# Patient Record
Sex: Female | Born: 1975 | Race: White | Hispanic: Yes | Marital: Single | Smoking: Never smoker
Health system: Southern US, Community
[De-identification: ages and names within clinical notes are randomized; demographics above are authoritative.]

## PROBLEM LIST (undated history)

## (undated) ENCOUNTER — Ambulatory Visit (HOSPITAL_COMMUNITY): Admission: EM | Payer: Self-pay

---

## 2003-10-25 ENCOUNTER — Inpatient Hospital Stay (HOSPITAL_COMMUNITY): Admission: AD | Admit: 2003-10-25 | Discharge: 2003-10-25 | Payer: Self-pay | Admitting: Obstetrics & Gynecology

## 2003-12-19 ENCOUNTER — Inpatient Hospital Stay (HOSPITAL_COMMUNITY): Admission: AD | Admit: 2003-12-19 | Discharge: 2003-12-19 | Payer: Self-pay | Admitting: Obstetrics & Gynecology

## 2003-12-22 ENCOUNTER — Inpatient Hospital Stay (HOSPITAL_COMMUNITY): Admission: AD | Admit: 2003-12-22 | Discharge: 2003-12-24 | Payer: Self-pay | Admitting: Obstetrics and Gynecology

## 2004-12-29 ENCOUNTER — Ambulatory Visit: Payer: Self-pay | Admitting: *Deleted

## 2004-12-30 ENCOUNTER — Ambulatory Visit (HOSPITAL_COMMUNITY): Admission: RE | Admit: 2004-12-30 | Discharge: 2004-12-30 | Payer: Self-pay | Admitting: *Deleted

## 2005-01-12 ENCOUNTER — Ambulatory Visit: Payer: Self-pay | Admitting: *Deleted

## 2005-01-19 ENCOUNTER — Ambulatory Visit: Payer: Self-pay | Admitting: *Deleted

## 2005-01-26 ENCOUNTER — Ambulatory Visit: Payer: Self-pay | Admitting: *Deleted

## 2005-02-02 ENCOUNTER — Ambulatory Visit: Payer: Self-pay | Admitting: *Deleted

## 2005-02-09 ENCOUNTER — Ambulatory Visit (HOSPITAL_COMMUNITY): Admission: RE | Admit: 2005-02-09 | Discharge: 2005-02-09 | Payer: Self-pay | Admitting: *Deleted

## 2005-02-09 ENCOUNTER — Ambulatory Visit: Payer: Self-pay | Admitting: *Deleted

## 2005-02-10 ENCOUNTER — Encounter (INDEPENDENT_AMBULATORY_CARE_PROVIDER_SITE_OTHER): Payer: Self-pay | Admitting: Specialist

## 2005-02-10 ENCOUNTER — Inpatient Hospital Stay (HOSPITAL_COMMUNITY): Admission: AD | Admit: 2005-02-10 | Discharge: 2005-02-13 | Payer: Self-pay | Admitting: *Deleted

## 2005-02-10 ENCOUNTER — Ambulatory Visit: Payer: Self-pay | Admitting: Obstetrics and Gynecology

## 2005-02-15 ENCOUNTER — Inpatient Hospital Stay (HOSPITAL_COMMUNITY): Admission: AD | Admit: 2005-02-15 | Discharge: 2005-02-15 | Payer: Self-pay | Admitting: Obstetrics and Gynecology

## 2007-12-13 ENCOUNTER — Inpatient Hospital Stay (HOSPITAL_COMMUNITY): Admission: AD | Admit: 2007-12-13 | Discharge: 2007-12-13 | Payer: Self-pay | Admitting: Obstetrics & Gynecology

## 2007-12-13 ENCOUNTER — Ambulatory Visit: Payer: Self-pay | Admitting: Obstetrics & Gynecology

## 2008-01-12 ENCOUNTER — Inpatient Hospital Stay (HOSPITAL_COMMUNITY): Admission: AD | Admit: 2008-01-12 | Discharge: 2008-01-12 | Payer: Self-pay | Admitting: Obstetrics & Gynecology

## 2008-01-12 ENCOUNTER — Inpatient Hospital Stay (HOSPITAL_COMMUNITY): Admission: AD | Admit: 2008-01-12 | Discharge: 2008-01-14 | Payer: Self-pay | Admitting: Obstetrics & Gynecology

## 2008-01-12 ENCOUNTER — Ambulatory Visit: Payer: Self-pay | Admitting: Physician Assistant

## 2009-10-07 ENCOUNTER — Emergency Department (HOSPITAL_COMMUNITY): Admission: EM | Admit: 2009-10-07 | Discharge: 2009-10-08 | Payer: Self-pay | Admitting: Emergency Medicine

## 2010-07-20 IMAGING — CT CT ABD-PELV W/ CM
2 of 4 series · 17 of 46 positions shown, 19 images · IV contrast (water/omni  & 80ml omni 300)
Comparison: None available.

CLINICAL DATA: Abdominal pain.  Right lower quadrant pain.

CT ABDOMEN AND PELVIS WITH CONTRAST
TECHNIQUE: Multidetector CT imaging of the abdomen and pelvis was
performed following the standard protocol during bolus
administration of intravenous contrast.
Contrast: 80 ml Rmnipaque-FOO.

[Series 2: routine abdomen · axial · 0.81mm/px · z∈[-401,-26]mm · 14 of 83 slices shown, 16 images]
[im 4/83  soft-tissue]
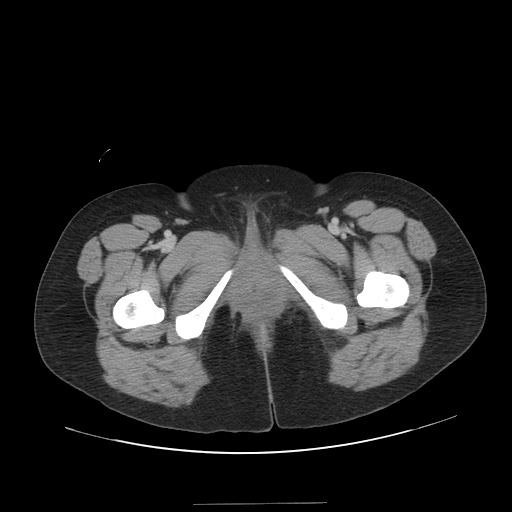
[im 4/83  bone]
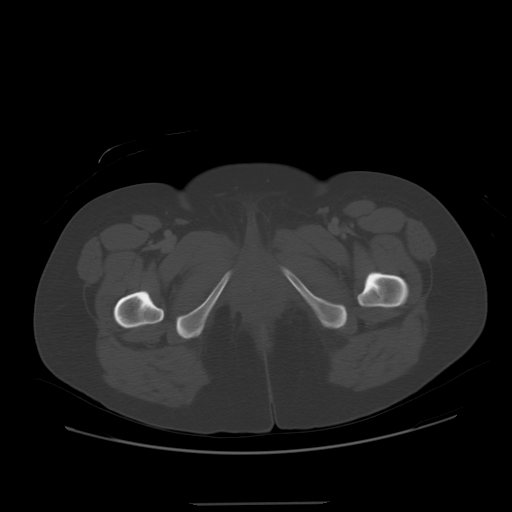
[im 12/83  soft-tissue]
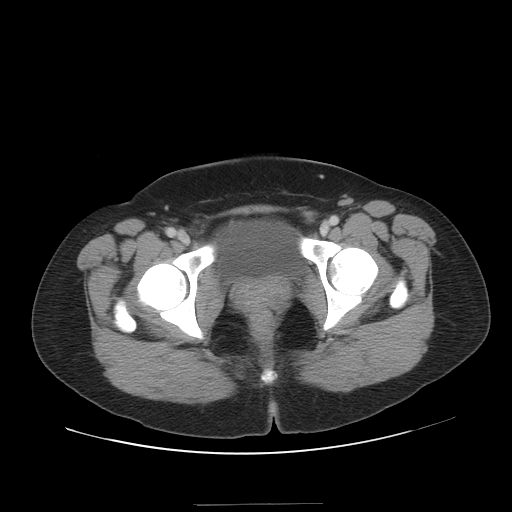
[im 15/83  soft-tissue]
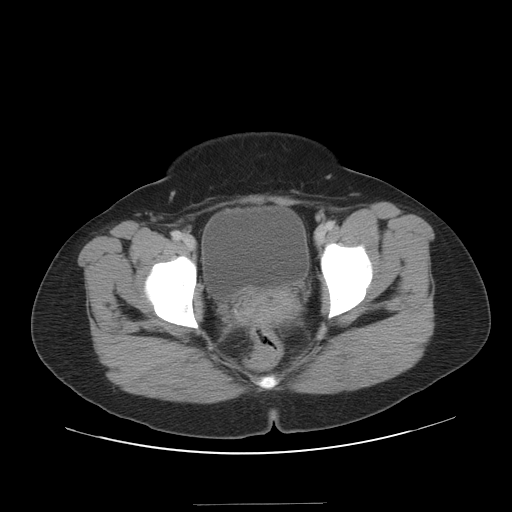
[im 23/83  soft-tissue]
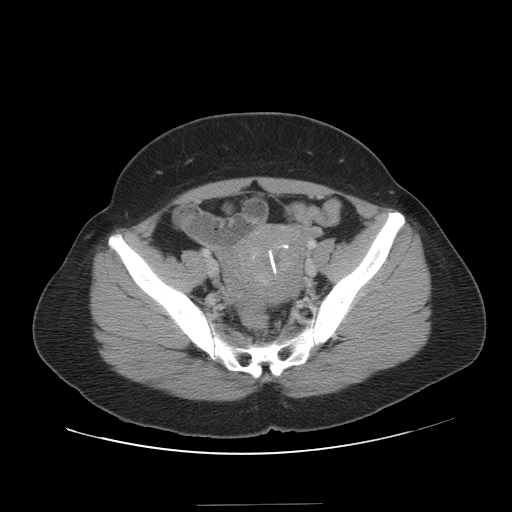
[im 27/83  soft-tissue]
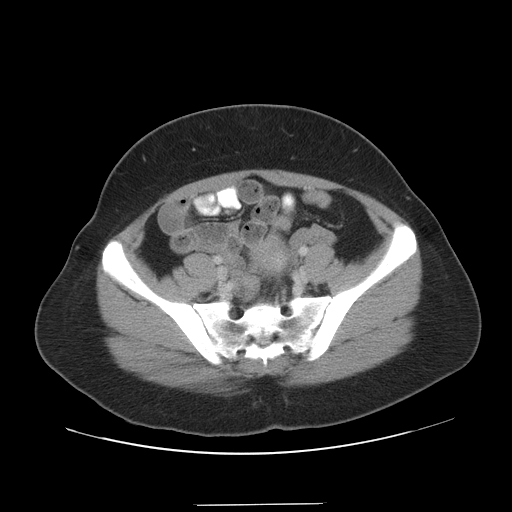
[im 34/83  soft-tissue]
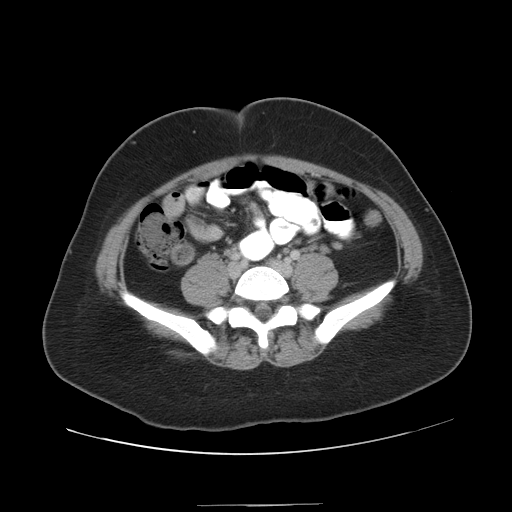
[im 38/83  soft-tissue]
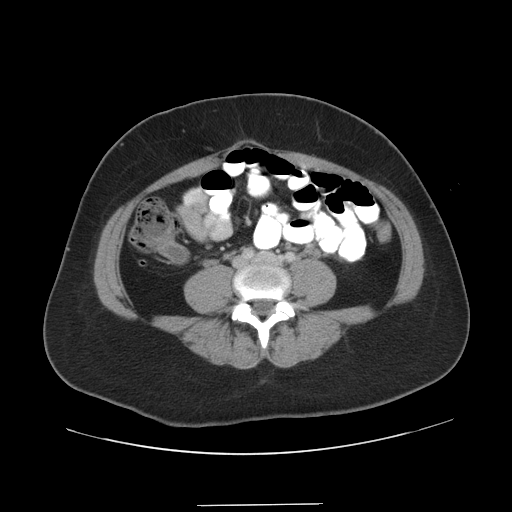
[im 45/83  soft-tissue]
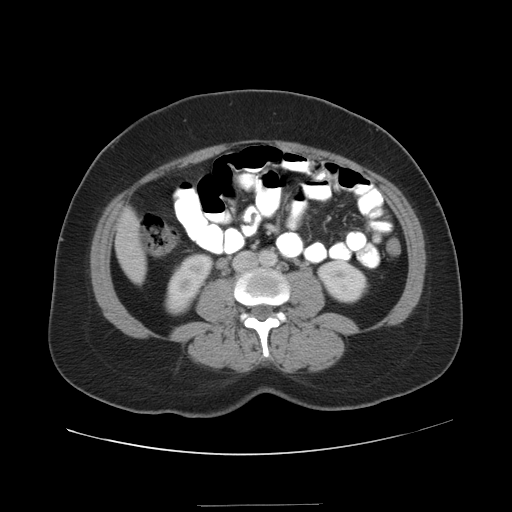
[im 49/83  soft-tissue]
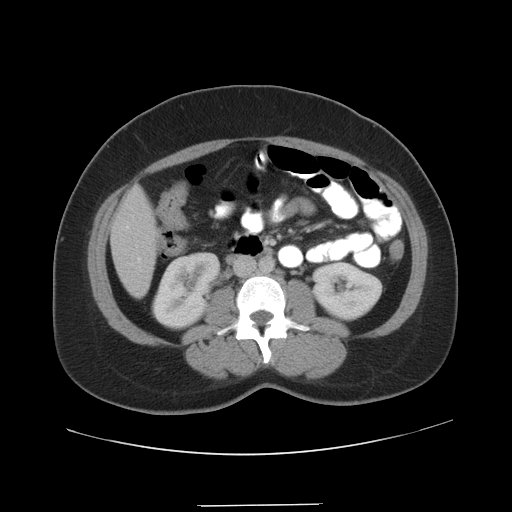
[im 49/83  bone]
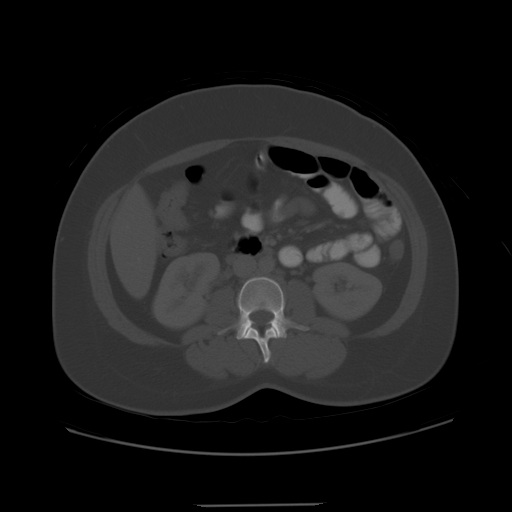
[im 56/83  soft-tissue]
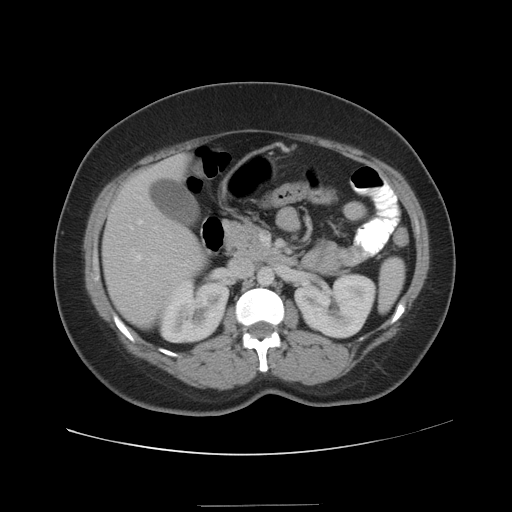
[im 60/83  soft-tissue]
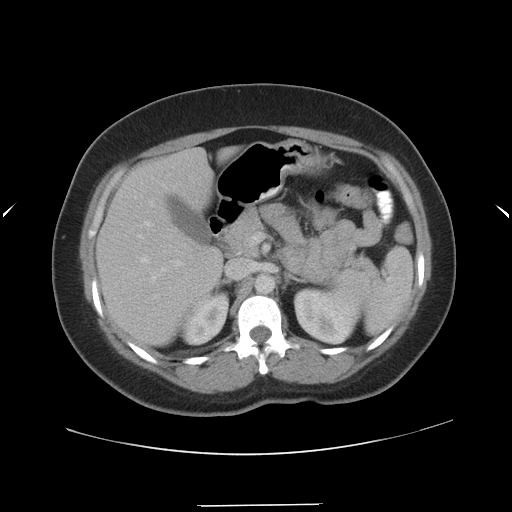
[im 68/83  soft-tissue]
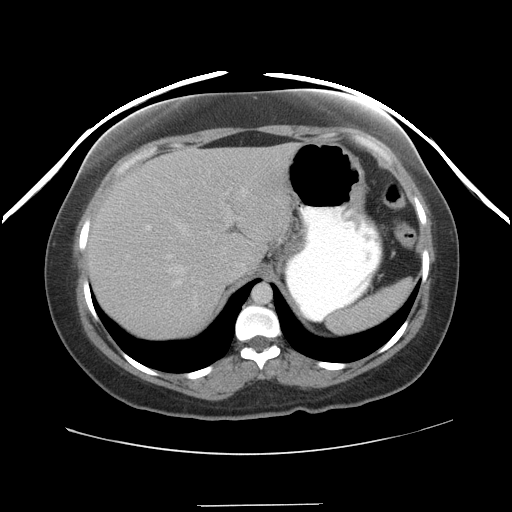
[im 71/83  soft-tissue]
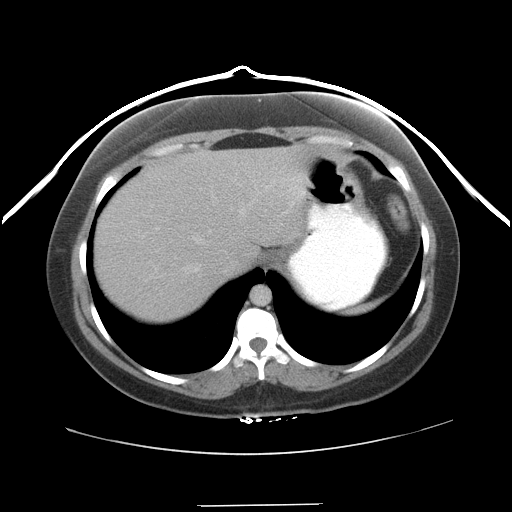
[im 79/83  soft-tissue]
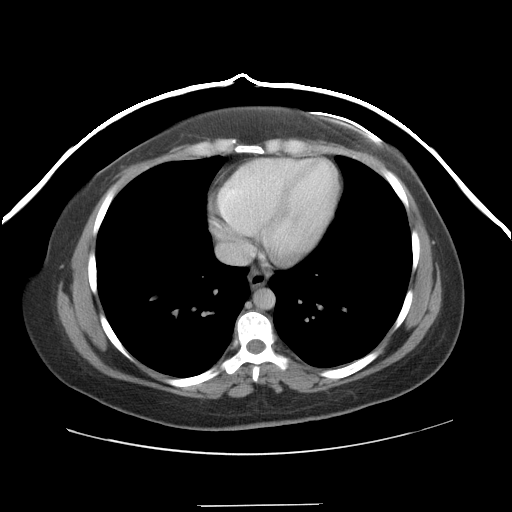

[Series 401: cor · coronal · 0.81mm/px · 3 of 96 slices shown]
[im 32/96  soft-tissue]
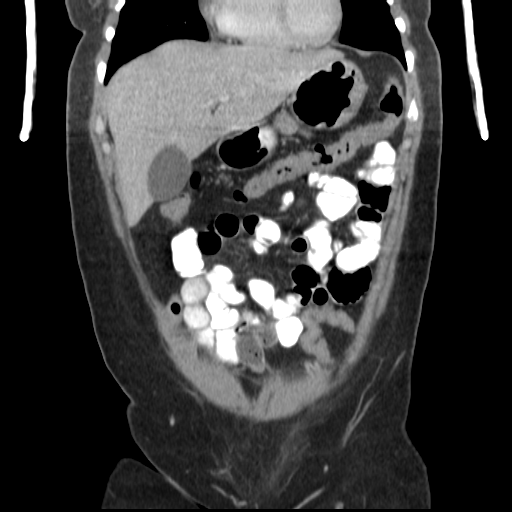
[im 43/96  soft-tissue]
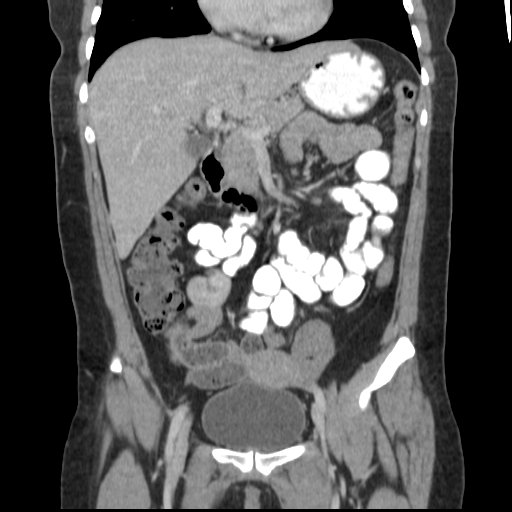
[im 53/96  soft-tissue]
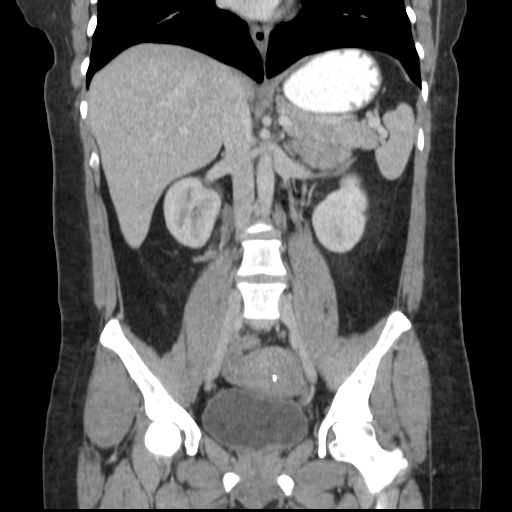

[17 of 46 positions shown; findings below may reference images not displayed]

FINDINGS: Lung bases clear.  No pleural or pericardial effusion.

The liver, gallbladder, biliary tree, adrenal glands, spleen,
pancreas and kidneys all appear normal.  The appendix is well seen
and normal in appearance.  Note is made there is fecal material in
the distal ileum consistent with slow transit.  No evidence of
bowel obstruction, mass or inflammatory process.  No
lymphadenopathy or fluid.
IMPRESSION: 1.  Fecal right material in the distal ileum is consistent with
slow transit of bowel contents.  There is no evidence of bowel
obstruction, inflammatory process and no mass is seen.
2.  Negative for appendicitis or other acute abnormality.

## 2010-09-23 LAB — URINALYSIS, ROUTINE W REFLEX MICROSCOPIC
Bilirubin Urine: NEGATIVE
Glucose, UA: NEGATIVE mg/dL
Ketones, ur: NEGATIVE mg/dL
Protein, ur: 100 mg/dL — AB
pH: 6 (ref 5.0–8.0)

## 2010-09-23 LAB — CBC
HCT: 39.8 % (ref 36.0–46.0)
Hemoglobin: 13.9 g/dL (ref 12.0–15.0)
MCV: 88.7 fL (ref 78.0–100.0)
Platelets: 265 10*3/uL (ref 150–400)
WBC: 17.1 10*3/uL — ABNORMAL HIGH (ref 4.0–10.5)

## 2010-09-23 LAB — WET PREP, GENITAL
Trich, Wet Prep: NONE SEEN
Yeast Wet Prep HPF POC: NONE SEEN

## 2010-09-23 LAB — BASIC METABOLIC PANEL
CO2: 26 mEq/L (ref 19–32)
Calcium: 9 mg/dL (ref 8.4–10.5)
Chloride: 103 mEq/L (ref 96–112)
Potassium: 3.6 mEq/L (ref 3.5–5.1)
Sodium: 136 mEq/L (ref 135–145)

## 2010-09-23 LAB — DIFFERENTIAL
Basophils Relative: 0 % (ref 0–1)
Eosinophils Absolute: 0 10*3/uL (ref 0.0–0.7)
Eosinophils Relative: 0 % (ref 0–5)
Lymphs Abs: 1.6 10*3/uL (ref 0.7–4.0)
Monocytes Relative: 3 % (ref 3–12)
Neutrophils Relative %: 87 % — ABNORMAL HIGH (ref 43–77)

## 2010-09-23 LAB — URINE MICROSCOPIC-ADD ON

## 2010-09-23 LAB — GC/CHLAMYDIA PROBE AMP, GENITAL: Chlamydia, DNA Probe: NEGATIVE

## 2010-09-23 LAB — POCT PREGNANCY, URINE: Preg Test, Ur: NEGATIVE

## 2010-11-20 NOTE — Discharge Summary (Signed)
NAME:  Michaela Finley, Michaela Finley         ACCOUNT NO.:  [id number]   MEDICAL RECORD NO.:  [id number]          PATIENT TYPE:  INP   LOCATION:  WOC                           FACILITY:  [facility]   PHYSICIAN:  Phil D. Okey Dupre, M.D.     DATE OF BIRTH:  24-Jan-1976   DATE OF ADMISSION:  02/09/2005  DATE OF DISCHARGE:  02/13/2005                                 DISCHARGE SUMMARY   REASON FOR ADMISSION:  Onset of labor.   PROCEDURE:  Prenatal none.   PROCEDURE:  Intrapartum cesarean low cervical transverse.   PROCEDURE:  Postpartum none.   COMPLICATIONS:  Operative and postpartum none.   DISCHARGE DIAGNOSIS:  Term pregnancy, delivered.   HOSPITAL COURSE:  A 35 year old, G4, P3-0-0-3 presented at 39-5/7 weeks in  labor and went on to a low transverse cesarean section.  The patient is  breastfeeding and desires IUD for contraception at six weeks follow-up  appointment.  Blood type A positive.  Rubella immune.   DISCHARGE INSTRUCTIONS:  Unrestricted activity.  Nothing per vagina for six  weeks.  Diet routine.   MEDICATIONS:  Percocet, ibuprofen, and prenatal vitamins.   STATUS:  Well.   INSTRUCTIONS:  Routine.   Discharged to home.  Discharge hematocrit 28.6, hemoglobin 9.3 dated February 11, 2005.   FOLLOW-UP NOTE:  Social work or case management has been contacted to have  the patient's postoperative staple removal performed within 2-3 days  following discharge.        JP/MEDQ  D:  02/13/2005  T:  02/13/2005  Job:  [id number]

## 2010-11-20 NOTE — Op Note (Signed)
NAME:  Michaela Finley, Michaela Finley         ACCOUNT NO.:  [id number]   MEDICAL RECORD NO.:  [id number]          PATIENT TYPE:  INP   LOCATION:  9145                          FACILITY:  [facility]   PHYSICIAN:  Phil D. Okey Dupre, M.D.     DATE OF BIRTH:  14-Oct-1975   DATE OF PROCEDURE:  02/10/2005  DATE OF DISCHARGE:                                 OPERATIVE REPORT   PROCEDURE:  Low transverse cesarean section.   PREOPERATIVE DIAGNOSIS:  Cephalopelvic disproportion.   POSTOPERATIVE DIAGNOSES:  1.  Cephalopelvic disproportion.  2.  Transverse presentation with deflexed vertex.   SURGEON:  Javier Glazier. Okey Dupre, M.D.   FIRST ASSISTANT:  Angie B. Childers, M.D.   ESTIMATED BLOOD LOSS:  500 mL.   SPECIMEN TO PATHOLOGY:  Placenta.   POSTOPERATIVE CONDITION:  Satisfactory.   ANESTHESIA:  Epidural.   PROCEDURE:  Under satisfactory epidural anesthesia with the patient in the  dorsal supine position, a Foley catheter in the urinary bladder.  The  abdomen was prepped and draped in the usual sterile manner, entered through  a transverse suprapubic incision situated 3 cm above the symphysis pubis and  extended for a total length of 14 cm.  The abdomen was entered by layers.  On entering the peritoneal cavity, the visceral peritoneum and the anterior  surface of the uterus was opened transversely.  By sharp dissection, the  bladder pushed away from the lower uterine segment, was entered by sharp and  blunt dissection, and from an LOT presentation with a deflexed head, the  baby boy was easily delivered.  Weighed 8 pounds 3 ounces, got a 9/9 Apgar.  Cord pH was 7.33.  The cord was doubly clamped and divided while the airway  was being suctioned with the bulb suction and the cord divided and the baby  handed to the pediatrician.  Samples of blood taken from the cord for  analysis.  Placenta spontaneously removed and the uterus explored.  The  uterus was closed with a continuous running locked 0 Vicryl on an  atraumatic  needle.  One extra figure-of-eight was placed in the right lateral end of  the incision where there was continued oozing.  At the end of the procedure,  no active bleeding was noted.  The area was irrigated with normal saline.  The ovaries and tubes were examined and found to be normal, and the fascia  was closed with continuous running 0 Vicryl on an atraumatic  needle.  Subcutaneous bleeders were controlled with hot cautery, skin edge  staples were used for skin edge approximation.  A dry sterile dressing was  applied.  The patient tolerated the procedure well and was transferred to  the recovery room in satisfactory condition.       PDR/MEDQ  D:  02/10/2005  T:  02/10/2005  Job:  [id number]

## 2011-04-01 LAB — CBC
HCT: 37.6
HCT: 43.5
MCHC: 33.9
MCV: 89.6
Platelets: 191
Platelets: 207
RDW: 14.3
WBC: 13.4 — ABNORMAL HIGH

## 2011-04-01 LAB — URINALYSIS, ROUTINE W REFLEX MICROSCOPIC
Ketones, ur: >80 — AB
Nitrite: NEGATIVE
Protein, ur: NEGATIVE
pH: 6.5

## 2016-10-29 ENCOUNTER — Ambulatory Visit: Payer: Self-pay | Attending: Family Medicine | Admitting: Family Medicine

## 2016-10-29 VITALS — BP 119/76 | HR 74 | Temp 98.1°F | Resp 18 | Ht 59.0 in | Wt 165.4 lb

## 2016-10-29 DIAGNOSIS — G8929 Other chronic pain: Secondary | ICD-10-CM | POA: Insufficient documentation

## 2016-10-29 DIAGNOSIS — Z833 Family history of diabetes mellitus: Secondary | ICD-10-CM | POA: Insufficient documentation

## 2016-10-29 DIAGNOSIS — M25562 Pain in left knee: Secondary | ICD-10-CM | POA: Insufficient documentation

## 2016-10-29 DIAGNOSIS — F419 Anxiety disorder, unspecified: Secondary | ICD-10-CM

## 2016-10-29 DIAGNOSIS — Z0000 Encounter for general adult medical examination without abnormal findings: Secondary | ICD-10-CM | POA: Insufficient documentation

## 2016-10-29 MED ORDER — IBUPROFEN 600 MG PO TABS: 600.0000 mg | ORAL_TABLET | Freq: Three times a day (TID) | ORAL | 0 refills | Status: DC | PRN

## 2016-10-29 MED FILL — IBUPROFEN 600 MG TABLET: 600 | 10 days supply | Qty: 30 | Fill #0

## 2016-10-29 NOTE — Progress Notes (Signed)
Patient is here for establish care  Patient is here for physical Blood work'  Patient is not taking medication at all  Patient has not eaten    Patient complains about fatigue  Left knee pain that comes and goes

## 2016-10-29 NOTE — Patient Instructions (Addendum)
Dolor de rodilla (Knee Pain) El dolor de rodilla es un problema frecuente y puede tener muchas causas. A menudo desaparece si se siguen las instrucciones del mdico para el cuidado Facilities manager. El tratamiento del dolor continuo depender de su causa. Si el dolor persiste, tal vez haya que realizar ms estudios para Scientist, forensic, los cuales pueden incluir radiografas u otros estudios de diagnstico por imgenes de la rodilla. CUIDADOS EN EL HOGAR  Tome los medicamentos solamente como se lo haya indicado el mdico.  Mantenga la rodilla en reposo y en alto (elevada) mientras est descansando.  No haga cosas que le causen dolor o que lo intensifiquen.  Evite las [employer] ambos pies se separan del suelo al mismo tiempo, por ejemplo, correr, saltar la soga o hacer saltos de tijera.  Aplique hielo sobre la zona de la rodilla:  Ponga el hielo en una bolsa plstica.  Coloque una toalla entre la piel y la bolsa de hielo.  Coloque el hielo durante 20 minutos, 2 a 3 veces por da.  Pregntele al mdico si debe usar una Neurosurgeon.  Duerma con una almohada debajo de la rodilla.  Baje de peso si es necesario. El sobrepeso puede aumentar el dolor de rodilla.  No consuma ningn producto que contenga tabaco, lo que incluye cigarrillos, tabaco de Theatre manager o Administrator, Civil Service. Si necesita ayuda para dejar de fumar, consulte al mdico. Fumar puede retrasar la curacin de cualquier problema que tenga en el hueso y Nurse, learning disability. SOLICITE AYUDA SI:  El dolor de rodilla no desaparece, cambia o empeora.  Tiene fiebre junto con dolor de rodilla.  La rodilla le falla o se le queda trabada.  La rodilla est ms hinchada. SOLICITE AYUDA DE INMEDIATO SI:  La rodilla est caliente al tacto.  Tiene dolor en el pecho o dificultad para respirar. Esta informacin no tiene Theme park manager el consejo del mdico. Asegrese de hacerle al mdico cualquier pregunta que  tenga. Document Released: 01/16/2014 Document Revised: 01/16/2014 Document Reviewed: 08/22/2013 Elsevier Interactive Patient Education  2017 Elsevier Inc.   Dieta restringida en grasas y colesterol (Fat and Cholesterol Restricted Diet) El exceso de grasas y colesterol en la dieta puede causar problemas de [place]. Esta dieta lo ayudar a Pharmacologist las grasas y Print production planner en los niveles normales para evitar enfermarse. QU TIPOS DE GRASAS DEBO ELEGIR?  Elija grasas monosaturadas y polinsaturadas. Estas se encuentran en alimentos como el aceite de oliva, aceite de canola, semillas de lino, nueces, almendras y semillas.  Consuma ms grasas omega-3. Las mejores opciones incluyen salmn, caballa, sardinas, atn, aceite de lino y semillas de lino molidas.  Limite el consumo de grasas saturadas, que se encuentran en productos de origen animal, como carnes, mantequilla y crema. Tambin pueden estar en productos vegetales, como aceite de palma, de palmiste y de coco.  Evite los alimentos con aceites parcialmente hidrogenados. Estos contienen grasas trans. Entre los ejemplos de alimentos con grasas trans se incluyen margarinas en barra, algunas margarinas untables, galletas dulces o saladas y otros productos horneados. QU PAUTAS GENERALES DEBO SEGUIR?  Lea las etiquetas de los alimentos. Busque las palabras "grasas trans" y "grasas saturadas".  Al preparar una comida:  Llene la mitad del plato con verduras y ensaladas de hojas verdes.  Llene un cuarto del plato con cereales integrales. Busque la palabra "integral" en Estate agent de la lista de ingredientes.  Llene un cuarto del plato con alimentos con protenas magras.  Sherrilyn Rist  alimentos con ms fibra, como [place], [place], frijoles, guisantes y [place].  Coma ms comidas caseras. Coma menos en los restaurantes y los bares.  Limite o evite el alcohol.  Limite los alimentos con alto contenido de almidn y International aid/development worker.  Limite el  consumo de alimentos fritos.  Cocine los alimentos sin frerlos. Las opciones de coccin ms [place] son Development worker, community, Regulatory affairs officer, Software engineer y asar a Patent attorney.  Baje de peso si es necesario. Aunque pierda [employer], esto puede ser importante para la salud general. Tambin puede ayudar a prevenir enfermedades como diabetes y enfermedad cardaca. QU ALIMENTOS PUEDO COMER? Cereales Cereales integrales, como los panes de salvado o [place], las [place], los cereales y las pastas. Avena sin endulzar, trigo, [place], quinua o arroz integral. Tortillas de harina de maz o de salvado. Verduras Verduras frescas o congeladas (crudas, al vapor, asadas o grilladas). Ensaladas de hojas verdes. Nils Pyle Nils Pyle frescas, en conserva (en su jugo natural) o frutas congeladas. Carnes y otros productos con protenas Carne de res molida (al 85% o ms [place]), carne de res de animales alimentados con pastos o carne de res sin la grasa. Pollo o pavo sin piel. Carne de pollo o de [place]. Cerdo sin la grasa. Todos los pescados y frutos de mar. Huevos. Porotos, guisantes o lentejas secos. Frutos secos o semillas sin sal. Frijoles secos o en lata sin sal. Lcteos Productos lcteos con bajo contenido de grasas, como [place] o al 1%, quesos reducidos en grasas o al 2%, ricota con bajo contenido de grasas o [employer], o yogur natural con bajo contenido de [place]. Grasas y [employer] untables que no contengan grasas trans. [place] y condimentos para ensaladas livianos o reducidos en grasas. [place]. Aceites de oliva, canola, ssamo o crtamo. Mantequilla natural de cacahuate o almendra (elija la que no tenga agregado de aceite o azcar). Los artculos mencionados arriba pueden no ser [employer] de las bebidas o los alimentos recomendados. Comunquese con el nutricionista para conocer ms opciones. QU ALIMENTOS NO SE RECOMIENDAN? Cereales Pan blanco. Pastas blancas. Arroz blanco. Pan de  maz. Bagels, pasteles y croissants. Galletas saladas que contengan grasas trans. Verduras Papas blancas. MazHoover Brunette con crema o fritas. Verduras en salsa de [place]. Nils Pyle Frutas secas. Fruta enlatada en almbar liviano o espeso. Jugo de frutas. Carnes y otros productos con protenas Cort[place] de carne con Holiday representative. Costillas, alas de pollo, tocineta, salchicha, mortadela, salame, chinchulines, tocino, perros calientes, salchichas alemanas y embutidos envasados. Hgado y otros rganos. Lcteos Leche entera o al 2%, crema, mezcla de [place] y crema, y queso crema. Quesos enteros. Yogur entero o endulzado. Quesos con toda su grasa. Cremas no lcteas y coberturas batidas. Quesos procesados, quesos para untar o cuajadas. Dulces y postres Jarabe de maz, azcares, miel y Radio broadcast assistant. Caramelos. Mermelada y [place]. Doreen Beam. Cereales endulzados. Galletas, pasteles, bizcochuelos, donas, muffins y helado. Grasas y [address], [place] en barra, [place] de [place], [place], [place] clarificada o grasa de tocino. Aceites de coco, de palmiste o de palma. Bebidas Alcohol. Bebidas endulzadas (como refrescos, limonadas y bebidas frutales o ponches). Los artculos mencionados arriba pueden no ser [employer] de las bebidas y los alimentos que se Theatre stage manager. Comunquese con el nutricionista para obtener ms informacin. Esta informacin no tiene Theme park manager el consejo del mdico. Asegrese de hacerle al mdico cualquier pregunta que tenga. Document Released: 12/21/2011 Document Revised: 07/12/2014 Document Reviewed: 09/20/2013 Elsevier Interactive Patient Education  2017 [employer].

## 2016-10-29 NOTE — Progress Notes (Signed)
Subjective:   Patient ID: Michaela Finley, female    DOB: 12-28-1975, 41 y.o.   MRN: [id number]  Chief Complaint  Patient presents with  . Establish Care   HPI Crytal Finley 41 y.o. female presents with   Annual physical examination: Denies CP, SOB, BLE swelling, or palpitations. She is a non-smoker. She does not drink alcohol. Denies family history of HTN or cancer. Parents have DM. Reports sister has history of migraine headaches. Symptoms of fatigue for 1 year. Denies any other constitutional symptoms. Left knee pain for 6 months. Denies history of injury or swelling. Reports taking Advil for relief of symptoms. Anxiety for several months. Denies any SI/HI. Denies any recent stressors. Denies taking anything for symptoms.   No past medical history on file.  No past surgical history on file.  Family History  Problem Relation Age of Onset  . Diabetes Mother   . Diabetes Father   . Migraines Sister     Social History   Social History  . Marital status: Single    Spouse name: N/A  . Number of children: N/A  . Years of education: N/A   Occupational History  . Not on file.   Social History Main Topics  . Smoking status: Never Smoker  . Smokeless tobacco: Never Used  . Alcohol use Not on file  . Drug use: Unknown  . Sexual activity: Not on file   Other Topics Concern  . Not on file   Social History Narrative  . No narrative on file    No outpatient prescriptions prior to visit.   No facility-administered medications prior to visit.     No Known Allergies  Review of Systems  Constitutional: Positive for malaise/fatigue.  HENT: Negative.   Eyes: Positive for blurred vision.       Decreased visual acuity.  Respiratory: Negative.   Cardiovascular: Negative.   Gastrointestinal: Negative.   Genitourinary: Negative.   Musculoskeletal: Positive for joint pain.  Skin: Negative.   Neurological: Negative.   Psychiatric/Behavioral: The patient is  nervous/anxious.       Objective:     BP 119/76 (BP Location: Left Arm, Patient Position: Sitting, Cuff Size: Normal)   Pulse 74   Temp 98.1 F (36.7 C) (Oral)   Resp 18   Ht _0  (1.499 m)   Wt 165 lb 6.4 oz (75 kg)   SpO2 99%   BMI 33.41 kg/m  Wt Readings from Last 3 Encounters:  10/29/16 165 lb 6.4 oz (75 kg)   Physical Exam  Constitutional: She is oriented to person, place, and time. She appears well-developed and well-nourished.  HENT:  Head: Normocephalic and atraumatic.  Right Ear: External ear normal.  Left Ear: External ear normal.  Nose: Nose normal.  Mouth/Throat: Oropharynx is clear and moist.  Eyes: Conjunctivae and EOM are normal. Pupils are equal, round, and reactive to light.  Neck: Normal range of motion. Neck supple. No JVD present.  Cardiovascular: Normal rate, regular rhythm, normal heart sounds and intact distal pulses.   Pulmonary/Chest: Effort normal and breath sounds normal.  Abdominal: Soft. Bowel sounds are normal.  Musculoskeletal: Normal range of motion.  Neurological: She is alert and oriented to person, place, and time. She has normal reflexes.  Skin: Skin is warm and dry.  Psychiatric: She has a normal mood and affect. She expresses no homicidal and no suicidal ideation. She expresses no suicidal plans and no homicidal plans.  Nursing note and vitals reviewed.   Lab Results  Component Value Date   TSH 2.510 10/29/2016   Lab Results  Component Value Date   WBC 9.2 10/29/2016   HGB 13.9 10/08/2009   HCT 44.5 10/29/2016   MCV 90 10/29/2016   PLT 221 10/29/2016   Lab Results  Component Value Date   NA 138 10/29/2016   K 4.7 10/29/2016   CO2 17 (L) 10/29/2016   GLUCOSE 95 10/29/2016   BUN 12 10/29/2016   CREATININE 0.65 10/29/2016   BILITOT 0.4 10/29/2016   ALKPHOS 88 10/29/2016   AST 74 (H) 10/29/2016   ALT 145 (H) 10/29/2016   PROT 7.8 10/29/2016   ALBUMIN 4.5 10/29/2016   CALCIUM 9.2 10/29/2016   Lab Results  Component  Value Date   CHOL 215 (H) 10/29/2016   Lab Results  Component Value Date   HDL 36 (L) 10/29/2016   Lab Results  Component Value Date   LDLCALC 151 (H) 10/29/2016   Lab Results  Component Value Date   TRIG 138 10/29/2016   Lab Results  Component Value Date   CHOLHDL 6.0 (H) 10/29/2016   Lab Results  Component Value Date   HGBA1C 5.5 10/29/2016   .phq GAD 7 : Generalized Anxiety Score 10/29/2016  Nervous, Anxious, on Edge 1  Control/stop worrying 1  Worry too much - different things 1  Trouble relaxing 1  Restless 0  Easily annoyed or irritable 1  Afraid - awful might happen 1  Total GAD 7 Score 6    Assessment & Plan:   Problem List Items Addressed This Visit    None    Visit Diagnoses    Annual physical exam    -  Primary   Relevant Orders   CMP14+EGFR (Completed)   CBC with Differential (Completed)   Lipid Panel (Completed)   Ambulatory referral to Dentistry   TSH (Completed)   Hemoglobin A1c (Completed)   Vitamin D, 25-hydroxy (Completed)   Chronic pain of left knee       Relevant Medications   ibuprofen (ADVIL,MOTRIN) 600 MG tablet   Health care maintenance       Relevant Orders   HIV antibody (with reflex) (Completed)   Anxious mood       Provided with counseling resources.        Meds ordered this encounter  Medications  . DISCONTD: ibuprofen (ADVIL,MOTRIN) 600 MG tablet    Sig: Take 1 tablet (600 mg total) by mouth every 8 (eight) hours as needed (Take with food.).    Dispense:  30 tablet    Refill:  0    Order Specific Question:   Supervising Provider    Answer:   Tresa Garter [id number]  . ibuprofen (ADVIL,MOTRIN) 600 MG tablet    Sig: Take 1 tablet (600 mg total) by mouth every 8 (eight) hours as needed (Take with food.).    Dispense:  30 tablet    Refill:  0    Follow up: Return in about 2 weeks (around 11/12/2016) for PAP.   Fredia Beets, FNP

## 2016-10-30 LAB — CMP14+EGFR
A/G RATIO: 1.4 (ref 1.2–2.2)
ALK PHOS: 88 IU/L (ref 39–117)
ALT: 145 IU/L — AB (ref 0–32)
AST: 74 IU/L — AB (ref 0–40)
Albumin: 4.5 g/dL (ref 3.5–5.5)
BILIRUBIN TOTAL: 0.4 mg/dL (ref 0.0–1.2)
BUN/Creatinine Ratio: 18 (ref 9–23)
BUN: 12 mg/dL (ref 6–20)
CHLORIDE: 100 mmol/L (ref 96–106)
CO2: 17 mmol/L — ABNORMAL LOW (ref 18–29)
Calcium: 9.2 mg/dL (ref 8.7–10.2)
Creatinine, Ser: 0.65 mg/dL (ref 0.57–1.00)
GFR calc non Af Amer: 112 mL/min/{1.73_m2} (ref >59)
GFR, EST AFRICAN AMERICAN: 129 mL/min/{1.73_m2} (ref >59)
GLUCOSE: 95 mg/dL (ref 65–99)
Globulin, Total: 3.3 g/dL (ref 1.5–4.5)
POTASSIUM: 4.7 mmol/L (ref 3.5–5.2)
Sodium: 138 mmol/L (ref 134–144)
TOTAL PROTEIN: 7.8 g/dL (ref 6.0–8.5)

## 2016-10-30 LAB — CBC WITH DIFFERENTIAL/PLATELET
BASOS ABS: 0 10*3/uL (ref 0.0–0.2)
Basos: 0 %
EOS (ABSOLUTE): 0.1 10*3/uL (ref 0.0–0.4)
Eos: 1 %
Hematocrit: 44.5 % (ref 34.0–46.6)
Hemoglobin: 15.3 g/dL (ref 11.1–15.9)
IMMATURE GRANS (ABS): 0.1 10*3/uL (ref 0.0–0.1)
Immature Granulocytes: 1 %
LYMPHS ABS: 2.8 10*3/uL (ref 0.7–3.1)
LYMPHS: 31 %
MCH: 30.8 pg (ref 26.6–33.0)
MCHC: 34.4 g/dL (ref 31.5–35.7)
MCV: 90 fL (ref 79–97)
Monocytes Absolute: 0.6 10*3/uL (ref 0.1–0.9)
Monocytes: 6 %
NEUTROS ABS: 5.6 10*3/uL (ref 1.4–7.0)
Neutrophils: 61 %
PLATELETS: 221 10*3/uL (ref 150–379)
RBC: 4.97 x10E6/uL (ref 3.77–5.28)
RDW: 14.1 % (ref 12.3–15.4)
WBC: 9.2 10*3/uL (ref 3.4–10.8)

## 2016-10-30 LAB — HEMOGLOBIN A1C
Est. average glucose Bld gHb Est-mCnc: 111 mg/dL
Hgb A1c MFr Bld: 5.5 % (ref 4.8–5.6)

## 2016-10-30 LAB — LIPID PANEL
Chol/HDL Ratio: 6 ratio — ABNORMAL HIGH (ref 0.0–4.4)
Cholesterol, Total: 215 mg/dL — ABNORMAL HIGH (ref 100–199)
HDL: 36 mg/dL — ABNORMAL LOW (ref >39)
LDL Calculated: 151 mg/dL — ABNORMAL HIGH (ref 0–99)
Triglycerides: 138 mg/dL (ref 0–149)
VLDL Cholesterol Cal: 28 mg/dL (ref 5–40)

## 2016-10-30 LAB — HIV ANTIBODY (ROUTINE TESTING W REFLEX): HIV SCREEN 4TH GENERATION: NONREACTIVE

## 2016-10-30 LAB — VITAMIN D 25 HYDROXY (VIT D DEFICIENCY, FRACTURES): Vit D, 25-Hydroxy: 17.7 ng/mL — ABNORMAL LOW (ref 30.0–100.0)

## 2016-10-30 LAB — TSH: TSH: 2.51 u[IU]/mL (ref 0.450–4.500)

## 2016-11-02 ENCOUNTER — Other Ambulatory Visit: Payer: Self-pay | Admitting: Family Medicine

## 2016-11-02 DIAGNOSIS — E559 Vitamin D deficiency, unspecified: Secondary | ICD-10-CM

## 2016-11-02 MED ORDER — VITAMIN D (ERGOCALCIFEROL) 1.25 MG (50000 UNIT) PO CAPS: 50000.0000 [IU] | ORAL_CAPSULE | ORAL | 0 refills | Status: DC

## 2016-11-03 ENCOUNTER — Telehealth: Payer: Self-pay

## 2016-11-03 MED FILL — VIT D2 1.25 MG (50,000 UNIT: 1.25 MG | 60 days supply | Qty: 8 | Fill #0

## 2016-11-03 NOTE — Telephone Encounter (Signed)
-----   Message from Lizbeth Bark, FNP sent at 11/02/2016  4:47 PM EDT ----- HIV is negative.  Vitamin D level was low. Vitamin D helps to keep bones strong. You were prescribed ergocalciferol (capsules) to increase your vitamin-d level. Once finished start taking OTC vitamin d supplement with 800 international units (IU) of vitamin-d per day.  Lipid levels were elevated. This can increase your risk of heart disease overtime. Start eating a diet low in saturated fat. Limit your intake of fried foods, red meats, and whole milk. Begin exercising at least 3-5 times per week for 30 minutes. Recommend recheck in 1 year. HgbA1c is  normal . You do not have diabetes.   Thyroid function normal Kidney function normal Liver function normal Labs normal

## 2016-11-03 NOTE — Telephone Encounter (Signed)
CMA call regarding lab results   Patient Verify DOB   Patient was aware and understood  

## 2016-11-05 ENCOUNTER — Encounter: Payer: Self-pay | Admitting: Family Medicine

## 2019-06-25 ENCOUNTER — Other Ambulatory Visit: Payer: Self-pay

## 2019-06-25 DIAGNOSIS — Z20822 Contact with and (suspected) exposure to covid-19: Secondary | ICD-10-CM

## 2019-06-26 LAB — NOVEL CORONAVIRUS, NAA: SARS-CoV-2, NAA: NOT DETECTED

## 2019-06-27 ENCOUNTER — Telehealth: Payer: Self-pay

## 2019-06-27 NOTE — Telephone Encounter (Signed)
Pt called with a interpreter:   Monica ID [phone]  Pt notified of negative COVID-19 results. Understanding verbalized.  [place]

## 2020-08-01 LAB — GLUCOSE, POCT (MANUAL RESULT ENTRY): POC Glucose: 121 mg/dl — AB (ref 70–99)

## 2022-11-25 ENCOUNTER — Ambulatory Visit (INDEPENDENT_AMBULATORY_CARE_PROVIDER_SITE_OTHER): Payer: Self-pay | Admitting: Primary Care

## 2022-11-26 ENCOUNTER — Telehealth (INDEPENDENT_AMBULATORY_CARE_PROVIDER_SITE_OTHER): Payer: Self-pay

## 2022-11-26 NOTE — Telephone Encounter (Unsigned)
Copied from CRM [id number]. Topic: Appointment Scheduling - Scheduling Inquiry for Clinic >> Nov 25, 2022  1:57 PM Marlow Baars wrote: Reason for CRM: The patient called to state she didn't realize her appt was rescheduled from 05/23 to 07/31. She showed up to the location and didn't know what was going on. She is a bit upset but did confirm she will be there for her appt on July 31st. Please assist further with interpretor if necessary

## 2023-01-25 ENCOUNTER — Emergency Department (HOSPITAL_BASED_OUTPATIENT_CLINIC_OR_DEPARTMENT_OTHER)
Admission: EM | Admit: 2023-01-25 | Discharge: 2023-01-26 | Disposition: A | Discharge status: Home / Self Care | Payer: No Typology Code available for payment source | Attending: Emergency Medicine | Admitting: Emergency Medicine

## 2023-01-25 ENCOUNTER — Encounter (HOSPITAL_BASED_OUTPATIENT_CLINIC_OR_DEPARTMENT_OTHER): Payer: Self-pay

## 2023-01-25 DIAGNOSIS — M542 Cervicalgia: Secondary | ICD-10-CM | POA: Diagnosis not present

## 2023-01-25 DIAGNOSIS — Y92410 Unspecified street and highway as the place of occurrence of the external cause: Secondary | ICD-10-CM | POA: Diagnosis not present

## 2023-01-25 DIAGNOSIS — M549 Dorsalgia, unspecified: Secondary | ICD-10-CM | POA: Diagnosis not present
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
## 2023-01-26 ENCOUNTER — Emergency Department (HOSPITAL_BASED_OUTPATIENT_CLINIC_OR_DEPARTMENT_OTHER): Payer: No Typology Code available for payment source | Admitting: Radiology

## 2023-01-26 LAB — PREGNANCY, URINE: Preg Test, Ur: NEGATIVE

## 2023-01-26 MED ORDER — LIDOCAINE 5 % EX PTCH: 1.0000 | MEDICATED_PATCH | CUTANEOUS | 0 refills | Status: DC

## 2023-01-26 MED ORDER — NAPROXEN 250 MG PO TABS
500.0000 mg | ORAL_TABLET | Freq: Once | ORAL | Status: AC
  Administered 2023-01-26: 500 mg via ORAL
  Filled 2023-01-26: qty 2

## 2023-01-26 MED ORDER — NAPROXEN 500 MG PO TABS: 500.0000 mg | ORAL_TABLET | Freq: Two times a day (BID) | ORAL | 0 refills | Status: DC

## 2023-01-26 MED ORDER — METHOCARBAMOL 500 MG PO TABS
1000.0000 mg | ORAL_TABLET | Freq: Once | ORAL | Status: AC
  Administered 2023-01-26: 1000 mg via ORAL
  Filled 2023-01-26: qty 2

## 2023-01-26 MED ORDER — METHOCARBAMOL 500 MG PO TABS: 500.0000 mg | ORAL_TABLET | Freq: Two times a day (BID) | ORAL | 0 refills | Status: DC

## 2023-01-26 MED ORDER — ACETAMINOPHEN 500 MG PO TABS
1000.0000 mg | ORAL_TABLET | Freq: Once | ORAL | Status: AC
  Administered 2023-01-26: 1000 mg via ORAL
  Filled 2023-01-26: qty 2

## 2023-01-26 NOTE — ED Notes (Signed)
Reviewed AVS/discharge instruction with patient. Time allotted for and all questions answered. Patient is agreeable for d/c and escorted to ed exit by staff.  
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
## 2023-02-02 ENCOUNTER — Encounter (INDEPENDENT_AMBULATORY_CARE_PROVIDER_SITE_OTHER): Payer: Self-pay | Admitting: Primary Care

## 2023-02-02 ENCOUNTER — Ambulatory Visit (INDEPENDENT_AMBULATORY_CARE_PROVIDER_SITE_OTHER): Payer: Self-pay | Admitting: Primary Care

## 2023-02-02 VITALS — BP 131/84 | HR 89 | Resp 16 | Ht <= 58 in | Wt 164.2 lb

## 2023-02-02 DIAGNOSIS — E6609 Other obesity due to excess calories: Secondary | ICD-10-CM

## 2023-02-02 DIAGNOSIS — Z7689 Persons encountering health services in other specified circumstances: Secondary | ICD-10-CM

## 2023-02-02 DIAGNOSIS — E538 Deficiency of other specified B group vitamins: Secondary | ICD-10-CM

## 2023-02-02 DIAGNOSIS — Z833 Family history of diabetes mellitus: Secondary | ICD-10-CM

## 2023-02-02 DIAGNOSIS — Z1211 Encounter for screening for malignant neoplasm of colon: Secondary | ICD-10-CM

## 2023-02-02 DIAGNOSIS — Z1159 Encounter for screening for other viral diseases: Secondary | ICD-10-CM

## 2023-02-02 DIAGNOSIS — E559 Vitamin D deficiency, unspecified: Secondary | ICD-10-CM

## 2023-02-02 DIAGNOSIS — Z6833 Body mass index (BMI) 33.0-33.9, adult: Secondary | ICD-10-CM

## 2023-02-02 DIAGNOSIS — E782 Mixed hyperlipidemia: Secondary | ICD-10-CM
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
## 2023-02-02 NOTE — Patient Instructions (Signed)
Prevencin de la hipertermia Preventing Hyperthermia La hipertermia ocurre cuando el cuerpo se sobrecalienta y no vuelve a su temperatura normal de [place]. Es un tipo de enfermedad por calor que normalmente se produce al [employer], cuando hace calor, o en entornos calurosos, durante [employer]. La hipertermia puede provocar afecciones como un golpe de calor, que es una emergencia mdica y puede ser potencialmente mortal. Cmo puede afectarme la hipertermia? La hipertermia puede causar diferentes tipos de enfermedades y sntomas relacionados con Company secretary. Esto incluye lo siguiente: Sarpullido por calor. Esta erupcin cutnea enrojecida y con manchas rojas tambin se denomina miliaria. Calambres musculares. Agotamiento por calor. Es cuando tu cuerpo no puede transpirar lo suficiente como para enfriarse. Es posible que sienta sed, mareos, cansancio y Engineer, mining de [place]. Golpe de calor. Esto ocurre cuando tiene Fish farm manager de 104 F (40 C) o ms y el cuerpo no puede enfriarse de [place]. Los sntomas incluyen respiracin y latidos cardacos rpidos, confusin, nuseas y convulsiones. El golpe de calor es una emergencia que puede causar dao en los rganos e incluso la [place]. Qu puede aumentar el riesgo? Las personas que trabajan o Engineer, water ejercicio al aire libre cuando hace calor tienen un mayor riesgo de enfermedades relacionadas con el calor e hipertermia. Tambin puede correr un mayor riesgo si: Tiene ms de 65 aos o es [employer]. Vive solo, especialmente en una casa sin aire acondicionado o ventilacin adecuada. Tiene mucho sobrepeso (es obeso). Padece ciertas afecciones mdicas crnicas, como enfermedad cardaca, mala circulacin u otras enfermedades vasculares, anemia drepanoctica, presin arterial alta. diabetes, enfermedad pulmonar o fibrosis qustica, o ha sufrido un accidente cerebrovascular. Toma ciertos medicamentos para la presin arterial alta,  antihistamnicos o tranquilizantes. Bebe alcohol en exceso o consume drogas, como cocana, herona o anfetaminas. Qu medidas puedo tomar para prevenir la hipertermia? Nutricin  Beba suficiente cantidad de agua o bebidas deportivas para mantener la orina de color amarillo plido. Cuando haga calor, beba cada 15 a 20 minutos, aunque no tenga sed. Beba lquidos con anticipacin si sabe que estar en un ambiente caluroso. Las bebidas deportivas con electrlitos son las mejores para Education officer, environmental actividades extenuantes o practicar deportes cuando hace calor. Evite tomar bebidas que contengan cafena o alcohol, cuando hace mucho calor afuera. Actividad Hable con el mdico antes de comenzar un nuevo tipo de actividad fsica o ejercicio. Consulte acerca de afecciones mdicas o medicamentos que podran aumentar su riesgo de sufrir enfermedades por calor. No trabaje ni haga actividad fsica cuando haga mucho calor y no se sienta bien o haya estado enfermo. Dele al cuerpo la oportunidad de adaptarse gradualmente al calor durante varios das. Esto ayudar a prevenir enfermedades y calambres. Use ropa ligera y transpirable que sea holgada y de colores claros. Estilo de vida Evite estar al aire [employer]. Use ropa transpirable, holgada y de colores claros. Cuando est al Guadalupe Dawn y haga un calor intenso, tome descansos frecuentes en zonas sombreadas o vaya adentro donde haya aire acondicionado o buena ventilacin. Protjase del sol usando un sombrero de ala ancha y una pantalla solar de amplio espectro con un factor de proteccin solar (FPS) 15 o ms. Informacin general No permanezca durante largos perodos de tiempo en automviles sin aire acondicionado. Nunca deje a bebs, nios o adultos mayores solos en un automvil cuando hace calor. Comunquese con un mdico si: Se siente dbil o mareado. Se desmaya. Tiene sntomas que pueden incluir calambres musculares, fatiga, enrojecimiento de  la cara  y el cuello y dolor de [place]. Solicite ayuda de inmediato si: Tiene signos de un golpe de calor que incluyen: Arts development officer de 104 F (40 C) o superior. Piel roja, caliente y 57. Pulso rpido y con golpeteos. Nuseas intensas, vmitos o ambos. Confusin. Estos sntomas pueden representar un problema grave que constituye Radio broadcast assistant. No espere a ver si los sntomas desaparecen. Solicite atencin mdica de inmediato. Comunquese con el servicio de emergencias de su localidad (911 en los [place]). No conduzca por sus propios medios [employer]. Resumen La hipertermia ocurre cuando el cuerpo se sobrecalienta y no vuelve a su temperatura normal de [place]. Es un tipo de enfermedad por calor que normalmente se produce al [employer], cuando hace calor, o en entornos calurosos, durante [employer]. Planifique con antelacin para evitar enfermedades por calor. Evite las actividades extenuantes durante las horas ms calurosas del da. Cuando est al [place]adalupe Dawn y haga un calor intenso, tome descansos frecuentes en zonas sombreadas o vaya adentro donde haya aire acondicionado o buena ventilacin. Cuando haga calor, beba cada 15 a 20 minutos, aunque no tenga sed. Busque atencin mdica inmediata si tiene sntomas de un golpe de Airline pilot, como respiracin y latidos cardacos rpidos, confusin, nuseas y convulsiones. El golpe de calor es una emergencia que puede causar dao en los rganos e incluso la [place]. Esta informacin no tiene Theme park manager el consejo del mdico. Asegrese de hacerle al mdico cualquier pregunta que tenga. Document Revised: 01/24/2020 Document Reviewed: 01/24/2020 Elsevier Patient Education  2024 [employer].

## 2023-02-03 ENCOUNTER — Ambulatory Visit (INDEPENDENT_AMBULATORY_CARE_PROVIDER_SITE_OTHER): Payer: Self-pay

## 2023-02-10 ENCOUNTER — Other Ambulatory Visit (INDEPENDENT_AMBULATORY_CARE_PROVIDER_SITE_OTHER): Payer: Self-pay | Admitting: Primary Care

## 2023-02-10 DIAGNOSIS — E782 Mixed hyperlipidemia: Secondary | ICD-10-CM

## 2023-02-10 DIAGNOSIS — E559 Vitamin D deficiency, unspecified: Secondary | ICD-10-CM

## 2023-02-10 MED ORDER — ERGOCALCIFEROL 1.25 MG (50000 UT) PO CAPS
50000.0000 [IU] | ORAL_CAPSULE | ORAL | 0 refills | Status: DC
Start: 2023-02-10 — End: 2023-10-11

## 2023-02-10 MED ORDER — GEMFIBROZIL 600 MG PO TABS: 600.0000 mg | ORAL_TABLET | Freq: Two times a day (BID) | ORAL | 1 refills | Status: DC

## 2023-02-11 ENCOUNTER — Telehealth (INDEPENDENT_AMBULATORY_CARE_PROVIDER_SITE_OTHER): Payer: Self-pay | Admitting: Primary Care

## 2023-02-11 NOTE — Telephone Encounter (Signed)
Using Spanish interpreter [place] # [phone], pt. Given lab results and instructions. Verbalizes understanding. Given her appointment for 02/14/23.

## 2023-02-14 ENCOUNTER — Encounter: Payer: Self-pay | Admitting: Pharmacist

## 2023-02-14 ENCOUNTER — Other Ambulatory Visit: Payer: Self-pay

## 2023-02-14 ENCOUNTER — Ambulatory Visit: Payer: Self-pay | Attending: Primary Care | Admitting: Pharmacist

## 2023-02-14 DIAGNOSIS — Z794 Long term (current) use of insulin: Secondary | ICD-10-CM

## 2023-02-14 DIAGNOSIS — Z7985 Long-term (current) use of injectable non-insulin antidiabetic drugs: Secondary | ICD-10-CM

## 2023-02-14 DIAGNOSIS — E119 Type 2 diabetes mellitus without complications: Secondary | ICD-10-CM

## 2023-02-14 MED ORDER — TRUE METRIX METER W/DEVICE KIT
PACK | 0 refills | Status: AC
Start: 2023-02-14 — End: ?
  Filled 2023-02-14: qty 1, 30d supply, fill #0

## 2023-02-14 MED ORDER — TRUEPLUS LANCETS 28G MISC
6 refills | Status: DC
  Filled 2023-02-14: qty 100, 33d supply, fill #0
  Filled 2023-04-11: qty 100, 33d supply, fill #1

## 2023-02-14 MED ORDER — TRUE METRIX BLOOD GLUCOSE TEST VI STRP
ORAL_STRIP | 6 refills | Status: DC
Start: 2023-02-14 — End: 2024-01-02
  Filled 2023-02-14: qty 100, 33d supply, fill #0
  Filled 2023-04-11: qty 100, 33d supply, fill #1

## 2023-02-14 MED ORDER — BASAGLAR KWIKPEN 100 UNIT/ML ~~LOC~~ SOPN
10.0000 [IU] | PEN_INJECTOR | Freq: Every day | SUBCUTANEOUS | 2 refills | Status: DC
  Filled 2023-02-14: qty 3, 30d supply, fill #0
  Filled 2023-03-22: qty 3, 30d supply, fill #1

## 2023-02-14 MED ORDER — TRULICITY 0.75 MG/0.5ML ~~LOC~~ SOAJ
0.7500 mg | SUBCUTANEOUS | 0 refills | Status: DC
Start: 2023-02-14 — End: 2023-03-03
  Filled 2023-02-14 – 2023-02-15 (×2): qty 2, 28d supply, fill #0

## 2023-02-14 MED ORDER — PEN NEEDLES 32G X 4 MM MISC
3 refills | Status: DC
  Filled 2023-02-14: qty 100, 90d supply, fill #0

## 2023-02-14 NOTE — Progress Notes (Signed)
S:     No chief complaint on file.  47 y.o. female who presents for diabetes evaluation, education, and management. Patient arrives in good spirits and presents without any assistance. PMH is significant for T2DM.   Patient was referred and established care with Primary Care Provider, Gwinda Passe, NP, on 02/03/23. At that time, she was diagnosed with new onset T2DM with A1c of 10.6.   Pt presents to clinic doing well today. Reports T2DM recently diagnosed 02/03/23. She has a family hx of diabetes in her father and mother. She has not started any diabetes medications, but recently picked up vitamin D and gemfibrozil prescribed after last visit. Denies history of heart attack, stroke, heart failure, thyroid cancer, and pancreatitis. She does not currently have insurance. Pt presented with multiple questions related to new diagnosis and medications.   Family/Social History: father and mother- diabetes  Current diabetes medications include: none  Insurance coverage: None  Patient denies hypoglycemic events.  Patient reports nocturia (nighttime urination). Patient reports neuropathy (nerve pain). Patient reports visual changes. (blurry vision)  Patient reported dietary habits: Eats 3-4 meals/day Breakfast: cereal, fruit Lunch: broccoli, rice, beans, salsa, eggs, meat Dinner: veggies, shrimp Snacks: no snacks Drinks: water    O:  Lab Results  Component Value Date   HGBA1C 10.6 (H) 02/03/2023   There were no vitals filed for this visit.  Lipid Panel     Component Value Date/Time   CHOL 175 02/03/2023 0847   TRIG 173 (H) 02/03/2023 0847   HDL 26 (L) 02/03/2023 0847   CHOLHDL 6.7 (H) 02/03/2023 0847   LDLCALC 118 (H) 02/03/2023 0847    Clinical Atherosclerotic Cardiovascular Disease (ASCVD): No  The 10-year ASCVD risk score (Arnett DK, et al., 2019) is: 4.3%   Values used to calculate the score:     Age: 81 years     Sex: Female     Is Non-Hispanic African American:  No     Diabetic: Yes     Tobacco smoker: No     Systolic Blood Pressure: 131 mmHg     Is BP treated: No     HDL Cholesterol: 26 mg/dL     Total Cholesterol: 175 mg/dL   Patient is participating in a Managed Medicaid Plan:  no  A/P: Diabetes newly diagnosed currently uncontrolled. Control is suboptimal due to need for new drug therapy. Will start Basaglar 10 units and Trulicity given she is symptomatic with new diagnosis of diabetes and BMI of 34. She will also be able to get these for free from the pharmacy downstairs until she is able to apply for Medicaid. Provided extensive diabetes education as this was her first office visit since being diagnosed. Patient is able to verbalize appropriate hypoglycemia management plan. -Started basal insulin Basaglar (insulin glargine) 10 units daily  -Started GLP-1 Trulicity (dulaglutide) 0.75 mg once weekly  -Check FBG once daily in the morning (provided rx for testing supplies to pharmacy) -Counseled pt to return to [facility] enrollment office to submit application -Patient educated on purpose, proper use, and potential adverse effects of Trulicity and Hospital doctor.  -Extensively discussed pathophysiology of diabetes, recommended lifestyle interventions, dietary effects on blood sugar control.  -Counseled on s/sx of and management of hypoglycemia.  -Next A1c anticipated 05/06/2023.   Hyperlipidemia, uncontrolled based on last lipid panel (02/03/23) TC 175, TG 173, HDL 26, LDL 118. LDL goal <100 mg/dl. Will stop gemfibrozil today given TG only mildly elevated. Plan to initiate moderate intensity statin at follow  up visit. Not discussed today due to the focus on diabetes and multiple medication changes as above. - Stop gemfibrozil - Consider initiation of moderate intensity statin at follow up visit  Written patient instructions provided. Patient verbalized understanding of treatment plan.  Total time in face to face counseling 45 minutes.    Follow-up:   Pharmacist ~2 weeks  Adam Phenix, PharmD PGY-1 Pharmacy Resident

## 2023-02-15 ENCOUNTER — Other Ambulatory Visit: Payer: Self-pay

## 2023-03-03 ENCOUNTER — Encounter: Payer: Self-pay | Admitting: Pharmacist

## 2023-03-03 ENCOUNTER — Ambulatory Visit: Payer: Self-pay | Attending: Primary Care | Admitting: Pharmacist

## 2023-03-03 ENCOUNTER — Other Ambulatory Visit: Payer: Self-pay

## 2023-03-03 DIAGNOSIS — Z7985 Long-term (current) use of injectable non-insulin antidiabetic drugs: Secondary | ICD-10-CM

## 2023-03-03 DIAGNOSIS — Z794 Long term (current) use of insulin: Secondary | ICD-10-CM

## 2023-03-03 DIAGNOSIS — E119 Type 2 diabetes mellitus without complications: Secondary | ICD-10-CM

## 2023-03-03 MED ORDER — TRULICITY 1.5 MG/0.5ML ~~LOC~~ SOAJ
1.5000 mg | SUBCUTANEOUS | 1 refills | Status: DC
Start: 2023-03-03 — End: 2023-06-17
  Filled 2023-03-03: qty 2, 28d supply, fill #0
  Filled 2023-04-11: qty 2, 28d supply, fill #1
  Filled 2023-05-13: qty 2, 28d supply, fill #2

## 2023-03-03 NOTE — Progress Notes (Cosign Needed Addendum)
S:     No chief complaint on file.  47 y.o. female who presents for diabetes evaluation, education, and management. Patient arrives in good spirits and presents without any assistance. PMH is significant for T2DM.   Patient was referred and established care with Primary Care Provider, Gwinda Passe, NP, on 02/03/23. At that time, she was diagnosed with new onset T2DM with A1c of 10.6. My pharmacy resident saw her on 02/14/23 and provided > 1 hour of education. We also started Trulicity and Lantus.  Pt presents to clinic doing well today. Reports T2DM recently diagnosed 02/03/23. She has a family hx of diabetes in her father and mother. She endorses adherence to her diabetes medications. Denies any NV, abdominal pain, or changes in vision.  Family/Social History: father and mother- diabetes  Current diabetes medications include: Basaglar 10u daily, Trulicity 0.75 mg weekly (has completed two injections)  Insurance coverage: None  Patient denies hypoglycemic events.  Patient denies nocturia (nighttime urination). Patient denies neuropathy (nerve pain). Patient denies visual changes. (blurry vision)  Patient reported dietary habits:  -Since last visit, she has increased her intake of fresh fruits and veggies.   Patient reported glucose levels at home:  -Fasting: 130s-180s -2-hr PPD/random: 130s   O:  No GM with her or CGM in place.   Lab Results  Component Value Date   HGBA1C 10.6 (H) 02/03/2023   There were no vitals filed for this visit.  Lipid Panel     Component Value Date/Time   CHOL 175 02/03/2023 0847   TRIG 173 (H) 02/03/2023 0847   HDL 26 (L) 02/03/2023 0847   CHOLHDL 6.7 (H) 02/03/2023 0847   LDLCALC 118 (H) 02/03/2023 0847    Clinical Atherosclerotic Cardiovascular Disease (ASCVD): No  The 10-year ASCVD risk score (Arnett DK, et al., 2019) is: 4.3%   Values used to calculate the score:     Age: 34 years     Sex: Female     Is Non-Hispanic African  American: No     Diabetic: Yes     Tobacco smoker: No     Systolic Blood Pressure: 131 mmHg     Is BP treated: No     HDL Cholesterol: 26 mg/dL     Total Cholesterol: 175 mg/dL   Patient is participating in a Managed Medicaid Plan:  no  A/P: Diabetes newly diagnosed currently uncontrolled. Control is suboptimal due to need for new drug therapy. Will start Basaglar 10 units and Trulicity given she is symptomatic with new diagnosis of diabetes and BMI of 34. She will also be able to get these for free from the pharmacy downstairs until she is able to apply for Medicaid. Provided extensive diabetes education as this was her first office visit since being diagnosed. Patient is able to verbalize appropriate hypoglycemia management plan. -Continue basal insulin Basaglar (insulin glargine) 10 units daily  -Finish Trulicity (dulaglutide) 0.75 mg once weekly in 2 weeks. Then, increase to 1.5 mg weekly thereafter.  -Check FBG once daily in the morning (provided rx for testing supplies to pharmacy) -Patient educated on purpose, proper use, and potential adverse effects of Trulicity and Hospital doctor.  -Extensively discussed pathophysiology of diabetes, recommended lifestyle interventions, dietary effects on blood sugar control.  -Counseled on s/sx of and management of hypoglycemia.  -Next A1c anticipated 05/06/2023.   Written patient instructions provided. Patient verbalized understanding of treatment plan.  Total time in face to face counseling 45 minutes.    Follow-up:  Pharmacist in  1 month.  Butch Penny, PharmD, Patsy [place], CPP Clinical Pharmacist [facility] & [facility] [phone]

## 2023-03-04 ENCOUNTER — Other Ambulatory Visit: Payer: Self-pay

## 2023-03-08 ENCOUNTER — Other Ambulatory Visit: Payer: Self-pay

## 2023-03-22 ENCOUNTER — Other Ambulatory Visit: Payer: Self-pay

## 2023-04-11 ENCOUNTER — Other Ambulatory Visit: Payer: Self-pay

## 2023-04-11 ENCOUNTER — Ambulatory Visit: Payer: Self-pay | Admitting: Pharmacist

## 2023-04-11 ENCOUNTER — Ambulatory Visit: Payer: Self-pay | Attending: Family Medicine | Admitting: Pharmacist

## 2023-04-11 DIAGNOSIS — E119 Type 2 diabetes mellitus without complications: Secondary | ICD-10-CM

## 2023-04-11 DIAGNOSIS — Z794 Long term (current) use of insulin: Secondary | ICD-10-CM

## 2023-04-11 DIAGNOSIS — Z7985 Long-term (current) use of injectable non-insulin antidiabetic drugs: Secondary | ICD-10-CM

## 2023-04-11 NOTE — Progress Notes (Unsigned)
Michaela RhodesCicero Finley [id number]   No chief complaint on file.  47 y.o. female who presents for diabetes evaluation, education, and management. Patient arrives in good spirits and presents without any assistance. PMH is significant for T2DM.   Patient was referred and established care with Primary Care Provider, Gwinda Passe, NP, on 02/03/23. At that time, she was diagnosed with new onset T2DM with A1c of 10.6. My pharmacy resident saw her on 02/14/23 and provided > 1 hour of education. We also started Trulicity and Lantus.   Last pharmacist visit on 8/29 patient reported adherence to Trulicity (had completed 2 doses of 0.75 mg) and Basaglar. Patient reported fasting BG 130s-180s and 2 hr PP/random BG 130s. Instructed her to continue Basaglar 10 units and increase Trulicity to 1.5 mg weekly after completing 2 more doses of 0.75 mg. Provided RX for testing supplies and educated patient to check FBG once daily in AM.  Today, reports she has had some dizziness and sweatiness since last visit. Reports 2 episodes of vomiting last week and mild nausea and constipation since increasing Trulicity to 1.5 mg. Previously was having 1 BM every day, now has 1 BM every other day. Denies epigastric pain. Notes she is due for her 4th injection of Trulicity 1.5 mg tomorrow. Has been checking BG in the morning 30 minutes after eating breakfast. Reports BG 133, 179, 99, 144. Has not checked fasting BG. Also reported itchiness in her scalp and behind her ears.  Family/Social History: father and mother- diabetes  Current diabetes medications include: Basaglar 10u daily at 6-7 PM, Trulicity 1.5 mg weekly (Tuesdays)   Insurance coverage: None  Patient denies hypoglycemic events.  Patient denies nocturia (nighttime urination). Patient denies neuropathy (nerve pain). Patient denies visual changes. (blurry vision)  Patient reported dietary habits:  3 meals/day - Breakfast: cereal, fruits, strawberries, banana - Lunch:  sometimes skips, beans, rice, broccoli, eggs with potatoes - Dinner: vegetable soup, broth with vegetables, chicken - Drinks: water only - Snacks: cookies  Patient reported glucose levels at home:  BG 30 minutes after breakfast: 133, 179, 99, 144.   O:  No GM with her or CGM in place.   Lab Results  Component Value Date   HGBA1C 10.6 (H) 02/03/2023   There were no vitals filed for this visit.  Lipid Panel     Component Value Date/Time   CHOL 175 02/03/2023 0847   TRIG 173 (H) 02/03/2023 0847   HDL 26 (L) 02/03/2023 0847   CHOLHDL 6.7 (H) 02/03/2023 0847   LDLCALC 118 (H) 02/03/2023 0847    Clinical Atherosclerotic Cardiovascular Disease (ASCVD): No  The 10-year ASCVD risk score (Arnett DK, et al., 2019) is: 4.3%   Values used to calculate the score:     Age: 42 years     Sex: Female     Is Non-Hispanic African American: No     Diabetic: Yes     Tobacco smoker: No     Systolic Blood Pressure: 131 mmHg     Is BP treated: No     HDL Cholesterol: 26 mg/dL     Total Cholesterol: 175 mg/dL   Patient is participating in a Managed Medicaid Plan:  no  A/P: Diabetes newly diagnosed currently uncontrolled, but improving based on patient reported home BG. Patient is able to verbalize appropriate hypoglycemia management plan. Difficult to assess BG control given she only checks BG 30 minutes after breakfast. Suspect she may be having hypoglycemia given patient reported sx  of sweatiness and dizziness and some BG on the lower side 30 minutes after breakfast. Given sx of hypoglycemia, will discontinue Basaglar today. Informed patient that it would probably be best to change Trulicity to another medication given her episodes of vomiting, nausea, constipation that has not improved with subsequent doses, but she prefers to continue on Trulicity. She denied any red flag symptoms like epigastric pain. Counseled her to increase dietary fiber intake and stay well hydrated to help with  constipation. Provided her with phone number to call if she experiences worsening GI sx with Trulicity or notices BG increases consistently above 180 after stopping insulin. Counseled her to check fasting BG once daily in the morning before breakfast.  - Discontinue Basaglar - Continue Trulicity 1.5 mg weekly - Check FBG once daily in AM before breakfast - Patient educated on purpose, proper use, and potential adverse effects of Trulicity and Hospital doctor.  - Extensively discussed pathophysiology of diabetes, recommended lifestyle interventions, dietary effects on blood sugar control.  - Counseled on s/sx of and management of hypoglycemia.  - Next A1c anticipated 05/06/23  Recommended she follow up with PCP for evaluation of itchiness in scalp/behind ears.  Written patient instructions provided. Patient verbalized understanding of treatment plan.  Total time in face to face counseling 40 minutes.    Follow-up:  Pharmacist in 1 month.  Jarrett Ables, PharmD PGY-1 Pharmacy Resident

## 2023-05-11 NOTE — Progress Notes (Signed)
S:   Interpreter Christian ID [phone], transferred to Norva Pavlov #[id number] 47 y.o. female who presents for diabetes evaluation, education, and management. Patient arrives in good spirits and presents without any assistance. PMH is significant for T2DM.   Patient was referred and established care with Primary Care Provider, Gwinda Passe, NP, on 02/03/23. At that time, she was diagnosed with new onset T2DM with A1c of 10.6%.  At last pharmacist visit on 04/11/23, patient reported symptoms of hypoglycemic events, therefore Basaglar was discontinued. Patient also reported N/V and constipation without epigastric pain since increasing Trulicity to 1.5mg , however, patient preferred to continue therapy. As patient was checking BGs soon after eating breakfast, it was advised she begin checking FBG.  Today, patient reports she has stopped her insulin as instructed and has no feelings of hypoglycemia. Patient reports fatigue throughout the day, especially after eating. Still reports some constipation with Trulicity. Used to have BM 2x/day, now may go a day without BM. Patient reports this is uncomfortable. Has not tried taking anything to try to treat it. N/V has resolved.  Family/Social History: -Fhx: DM in father and mother  Current diabetes medications: Trulicity 1.5 mg weekly (Tuesdays)  Previous diabetes medications: Basaglar (suspected hypoglycemia) No statin therapy noted. No missed doses of Trulicity.  Insurance coverage: None  Patient denies hypoglycemic events.  Patient denies nocturia (nighttime urination). Patient denies neuropathy (nerve pain). Patient reports new visual changes. (blurry vision, some feelings of tiredness with eyes, burning in eyes) Patient reports self foot exams.  Patient reported dietary habits:  3 meals/day - Breakfast: cereal, fruits, strawberries, banana - Lunch: sometimes skips, beans, rice, broccoli, eggs with potatoes - Dinner: vegetable soup, broth with  vegetables, chicken - Drinks: water only - Snacks: cookies  Patient reported glucose levels at home:  FBG: 105, 115 After food: 170-200  O:  Lab Results  Component Value Date   HGBA1C 6.2 05/12/2023   There were no vitals filed for this visit.  Lipid Panel     Component Value Date/Time   CHOL 175 02/03/2023 0847   TRIG 173 (H) 02/03/2023 0847   HDL 26 (L) 02/03/2023 0847   CHOLHDL 6.7 (H) 02/03/2023 0847   LDLCALC 118 (H) 02/03/2023 0847    Clinical Atherosclerotic Cardiovascular Disease (ASCVD): No  The 10-year ASCVD risk score (Arnett DK, et al., 2019) is: 4.3%   Values used to calculate the score:     Age: 76 years     Sex: Female     Is Non-Hispanic African American: No     Diabetic: Yes     Tobacco smoker: No     Systolic Blood Pressure: 131 mmHg     Is BP treated: No     HDL Cholesterol: 26 mg/dL     Total Cholesterol: 175 mg/dL   A/P: Diabetes recently diagnosed, currently controlled based on A1c today of 6.2%, which has significantly improved from 10.6%. FBG is well controlled, with post-prandial BG being slightly elevated but unsure of timing after food. While patient reports constipation associated with Trulicity is uncomfortable, she would like to continue therapy. Will add on metformin to attempt to regulate bowel movements, which will also provide some additional BG control. Patient voices understanding. Patient is able to verbalize appropriate hypoglycemia management plan. - Continue Trulicity 1.5 mg weekly - Start metformin XR 500mg  once daily - Extensively discussed pathophysiology of diabetes, recommended lifestyle interventions, dietary effects on blood sugar control.  - Counseled on s/sx of and management of hypoglycemia.  -  Next A1c anticipated 08/2023  ASCVD risk - most recent LDL is 118 (02/2023). While patient's ASCVD risk score is low at < 5%, the ADA guidelines do recommend a moderate-intensity statin for this patient. Discussed initiation of a  statin to reduce CV risk, which patient was agreeable. - Rosuvastatin 10mg  once daily script sent to pharmacy, however, discovered soon after that patient's LFTs from 02/03/2023 CMP were elevated. Patient had not yet picked up script. Messaged pharmacy to cancel rosuvastatin, and rosuvastatin order was canceled. LVM for patient using translator that stated we will hold off on starting rosuvastatin until labs are obtained at next PharmD visit.  Of note, patient's new vision changes appear to be related to dry eyes, rather than diabetes, given her excellent BG control. Plan to continue to monitor at upcoming visits.  Written patient instructions provided. Patient verbalized understanding of treatment plan.  Total time in face to face counseling 60 minutes.    Follow-up:  Pharmacist: 06/17/23 PCP: not scheduled  Nicole Kindred, PharmD PGY1 Pharmacy Resident 05/12/2023 5:12 PM

## 2023-05-12 ENCOUNTER — Encounter: Payer: Self-pay | Admitting: Pharmacist

## 2023-05-12 ENCOUNTER — Ambulatory Visit: Payer: Self-pay | Attending: Primary Care | Admitting: Pharmacist

## 2023-05-12 ENCOUNTER — Other Ambulatory Visit: Payer: Self-pay

## 2023-05-12 DIAGNOSIS — Z7985 Long-term (current) use of injectable non-insulin antidiabetic drugs: Secondary | ICD-10-CM

## 2023-05-12 DIAGNOSIS — Z794 Long term (current) use of insulin: Secondary | ICD-10-CM

## 2023-05-12 DIAGNOSIS — E119 Type 2 diabetes mellitus without complications: Secondary | ICD-10-CM

## 2023-05-12 LAB — POCT GLYCOSYLATED HEMOGLOBIN (HGB A1C): HbA1c, POC (controlled diabetic range): 6.2 % (ref 0.0–7.0)

## 2023-05-12 MED ORDER — ROSUVASTATIN CALCIUM 10 MG PO TABS
10.0000 mg | ORAL_TABLET | Freq: Every day | ORAL | 1 refills | Status: DC
  Filled 2023-05-12: qty 90, 90d supply, fill #0

## 2023-05-12 MED ORDER — METFORMIN HCL ER 500 MG PO TB24
500.0000 mg | ORAL_TABLET | Freq: Every day | ORAL | 1 refills | Status: DC
Start: 2023-05-12 — End: 2023-11-17
  Filled 2023-05-12: qty 90, 90d supply, fill #0
  Filled 2023-08-24: qty 90, 90d supply, fill #1

## 2023-05-13 ENCOUNTER — Other Ambulatory Visit: Payer: Self-pay

## 2023-06-17 ENCOUNTER — Other Ambulatory Visit: Payer: Self-pay

## 2023-06-17 ENCOUNTER — Encounter: Payer: Self-pay | Admitting: Pharmacist

## 2023-06-17 ENCOUNTER — Ambulatory Visit: Payer: Self-pay | Attending: Primary Care | Admitting: Pharmacist

## 2023-06-17 DIAGNOSIS — Z7985 Long-term (current) use of injectable non-insulin antidiabetic drugs: Secondary | ICD-10-CM

## 2023-06-17 DIAGNOSIS — E119 Type 2 diabetes mellitus without complications: Secondary | ICD-10-CM

## 2023-06-17 DIAGNOSIS — Z7984 Long term (current) use of oral hypoglycemic drugs: Secondary | ICD-10-CM

## 2023-06-17 MED ORDER — TRULICITY 1.5 MG/0.5ML ~~LOC~~ SOAJ
1.5000 mg | SUBCUTANEOUS | 1 refills | Status: DC
Start: 2023-06-17 — End: 2024-01-02
  Filled 2023-06-17: qty 2, 28d supply, fill #0
  Filled 2023-08-24: qty 2, 28d supply, fill #1
  Filled 2023-11-17: qty 2, 28d supply, fill #2
  Filled 2023-12-07: qty 2, 28d supply, fill #3

## 2023-06-17 NOTE — Progress Notes (Signed)
    S:   Interpreter Christian ID [phone], transferred to Michaela Finley #[id number] 47 y.o. female who presents for diabetes evaluation, education, and management. Patient arrives in good spirits and presents without any assistance. PMH is significant for T2DM.   Patient was referred and established care with Primary Care Provider, Gwinda Passe, NP, on 02/03/23. At that time, she was diagnosed with new onset T2DM with A1c of 10.6%.  At last pharmacist visit on 05/12/23 we added metformin. She is only on this and Trulicity at this time.   Today, pt reports doing well. Denies any GI distress today and her constipation has resolved. Tolerating the newly added metformin well. Continues to be adherent to this and her Trulicity.   Family/Social History: -Fhx: DM in father and mother  Current diabetes medications: Trulicity 1.5 mg weekly (Tuesdays)  Metformin 500 mg XR daily   Insurance coverage: None  Patient denies hypoglycemic events.  Patient denies nocturia (nighttime urination). Patient denies neuropathy (nerve pain). Patient denies new visual changes. Patient reports self foot exams.  Patient reported dietary habits:  3 meals/day - Breakfast: cereal, fruits, strawberries, banana - Lunch: sometimes skips, beans, rice, broccoli, eggs with potatoes - Dinner: vegetable soup, broth with vegetables, chicken - Drinks: water only - Snacks: cookies  Patient reported glucose levels at home:  Range: 130s-160s  O:  Lab Results  Component Value Date   HGBA1C 6.2 05/12/2023   There were no vitals filed for this visit.  Lipid Panel     Component Value Date/Time   CHOL 175 02/03/2023 0847   TRIG 173 (H) 02/03/2023 0847   HDL 26 (L) 02/03/2023 0847   CHOLHDL 6.7 (H) 02/03/2023 0847   LDLCALC 118 (H) 02/03/2023 0847    Clinical Atherosclerotic Cardiovascular Disease (ASCVD): No  The 10-year ASCVD risk score (Arnett DK, et al., 2019) is: 4.3%   Values used to calculate the score:      Age: 22 years     Sex: Female     Is Non-Hispanic African American: No     Diabetic: Yes     Tobacco smoker: No     Systolic Blood Pressure: 131 mmHg     Is BP treated: No     HDL Cholesterol: 26 mg/dL     Total Cholesterol: 175 mg/dL   A/P: Diabetes recently diagnosed, currently controlled based on A1c. She is currently asymptomatic. Patient is able to verbalize appropriate hypoglycemia management plan. - Continue Trulicity 1.5 mg weekly - Continue metformin XR 500mg  once daily - Extensively discussed pathophysiology of diabetes, recommended lifestyle interventions, dietary effects on blood sugar control.  - Counseled on s/sx of and management of hypoglycemia.  - Next A1c anticipated 08/2023  Written patient instructions provided. Patient verbalized understanding of treatment plan.  Total time in face to face counseling 60 minutes.    Follow-up:  PCP as soon as possible.   Butch Penny, PharmD, Patsy [place], CPP Clinical Pharmacist [facility] & [facility] [phone]

## 2023-06-20 ENCOUNTER — Other Ambulatory Visit (INDEPENDENT_AMBULATORY_CARE_PROVIDER_SITE_OTHER): Payer: Self-pay | Admitting: Primary Care

## 2023-06-20 DIAGNOSIS — G629 Polyneuropathy, unspecified: Secondary | ICD-10-CM

## 2023-06-20 MED ORDER — GABAPENTIN 100 MG PO CAPS: 100.0000 mg | ORAL_CAPSULE | Freq: Three times a day (TID) | ORAL | 3 refills | Status: DC

## 2023-06-30 ENCOUNTER — Other Ambulatory Visit (HOSPITAL_COMMUNITY)
Admission: RE | Admit: 2023-06-30 | Discharge: 2023-06-30 | Disposition: A | Discharge status: Home / Self Care | Payer: Self-pay | Source: Ambulatory Visit | Attending: Primary Care | Admitting: Primary Care

## 2023-06-30 ENCOUNTER — Ambulatory Visit (INDEPENDENT_AMBULATORY_CARE_PROVIDER_SITE_OTHER): Payer: Self-pay | Admitting: Primary Care

## 2023-06-30 ENCOUNTER — Encounter (INDEPENDENT_AMBULATORY_CARE_PROVIDER_SITE_OTHER): Payer: Self-pay | Admitting: Primary Care

## 2023-06-30 VITALS — BP 134/89 | HR 77 | Resp 16 | Wt 162.8 lb

## 2023-06-30 DIAGNOSIS — Z7985 Long-term (current) use of injectable non-insulin antidiabetic drugs: Secondary | ICD-10-CM

## 2023-06-30 DIAGNOSIS — Z113 Encounter for screening for infections with a predominantly sexual mode of transmission: Secondary | ICD-10-CM

## 2023-06-30 DIAGNOSIS — Z124 Encounter for screening for malignant neoplasm of cervix: Secondary | ICD-10-CM

## 2023-06-30 DIAGNOSIS — Z1211 Encounter for screening for malignant neoplasm of colon: Secondary | ICD-10-CM

## 2023-06-30 DIAGNOSIS — L918 Other hypertrophic disorders of the skin: Secondary | ICD-10-CM

## 2023-06-30 DIAGNOSIS — E119 Type 2 diabetes mellitus without complications: Secondary | ICD-10-CM

## 2023-06-30 DIAGNOSIS — Z7984 Long term (current) use of oral hypoglycemic drugs: Secondary | ICD-10-CM

## 2023-06-30 NOTE — Progress Notes (Signed)
  [facility]  WELL-WOMAN PHYSICAL & PAP Patient name: Michaela Finley MRN [id number]  Date of birth: [birth year] Chief Complaint:   Gynecologic Exam  History of Present Illness:   Michaela Finley is a 47 y.o. No obstetric history on file. female being seen today for a routine well-woman exam.   CC:gyn  The current method of family planning is Mirena. No LMP recorded. Family h/o breast cancer: No  Family h/o colorectal cancer: No  Health Maintenance  Topic Date Due   Yearly kidney health urinalysis for diabetes  Never done   DTaP/Tdap/Td vaccine (1 - Tdap) Never done   Pap with HPV screening  Never done   Colon Cancer Screening  Never done   Flu Shot  02/03/2023   COVID-19 Vaccine (1 - 2024-25 season) Never done   Yearly kidney function blood test for diabetes  02/03/2024   Hepatitis C Screening  Completed   HIV Screening  Completed   HPV Vaccine  Aged Out   Review of Systems:    Denies any headaches, blurred vision, fatigue, shortness of breath, chest pain, abdominal pain, abnormal vaginal discharge/itching/odor/irritation, problems with periods, bowel movements, urination, or intercourse unless otherwise stated above.  Pertinent History Reviewed:   Reviewed past medical,surgical, social and family history.  Reviewed problem list, medications and allergies.  Physical Assessment:   Vitals:   06/30/23 1605  BP: 134/89  Pulse: 77  Resp: 16  SpO2: 100%  Weight: 162 lb 12.8 oz (73.8 kg)  Body mass index is 34.03 kg/m.        Physical Examination:  General appearance - well appearing, and in no distress Mental status - alert, oriented to person, place, and time Psych:  She has a normal mood and affect Skin - warm and dry, normal color, no suspicious lesions noted Chest - effort normal, all lung fields clear to auscultation bilaterally Heart - normal rate and regular rhythm Neck:  midline trachea, no thyromegaly or nodules Breasts - breasts  appear normal, no suspicious masses, left breast nipple spilt white discharge thick clumpy no skin or nipple changes or axillary nodes Educated patient on proper self breast examination and had patient to demonstrate SBE. Abdomen - soft, nontender, nondistended, no masses or organomegaly Pelvic-VULVA: normal appearing vulva with no masses, tenderness or lesions   VAGINA: normal appearing vagina with normal color and discharge, no lesions   CERVIX: normal appearing cervix without discharge or lesions, no CMT UTERUS: uterus is felt to be normal size, shape, consistency and nontender  ADNEXA: No adnexal masses or tenderness noted. Extremities:  No swelling or varicosities noted  Assessment & Plan:   [name] was seen today for gynecologic exam.  Diagnoses and all orders for this visit:  Cervical cancer screening -     Cytology - PAP  Screen for STD (sexually transmitted disease) -     Cervicovaginal ancillary only  Colon cancer screening -     Fecal occult blood, imunochemical  Type 2 diabetes mellitus without complication, without long-term current use of insulin (HCC) -     Microalbumin / creatinine urine ratio   Acrochordon  This note has been created with Education officer, environmental. Any transcriptional errors are unintentional.   Grayce Sessions, NP 06/30/2023, 4:06 PM

## 2023-07-04 LAB — CERVICOVAGINAL ANCILLARY ONLY
Bacterial Vaginitis (gardnerella): POSITIVE — AB
Candida Glabrata: NEGATIVE
Candida Vaginitis: NEGATIVE
Chlamydia: NEGATIVE
Comment: NEGATIVE
Comment: NEGATIVE
Comment: NEGATIVE
Comment: NEGATIVE
Comment: NEGATIVE
Comment: NORMAL
Neisseria Gonorrhea: NEGATIVE
Trichomonas: NEGATIVE

## 2023-07-04 LAB — CYTOLOGY - PAP: Diagnosis: NEGATIVE

## 2023-07-12 ENCOUNTER — Other Ambulatory Visit: Payer: Self-pay

## 2023-07-12 ENCOUNTER — Other Ambulatory Visit (INDEPENDENT_AMBULATORY_CARE_PROVIDER_SITE_OTHER): Payer: Self-pay | Admitting: Primary Care

## 2023-07-12 MED ORDER — METRONIDAZOLE 500 MG PO TABS
500.0000 mg | ORAL_TABLET | Freq: Two times a day (BID) | ORAL | 0 refills | Status: DC
  Filled 2023-07-12: qty 14, 7d supply, fill #0

## 2023-08-24 ENCOUNTER — Other Ambulatory Visit: Payer: Self-pay

## 2023-08-31 ENCOUNTER — Ambulatory Visit: Payer: Self-pay | Admitting: Family Medicine

## 2023-08-31 ENCOUNTER — Encounter: Payer: Self-pay | Admitting: Family Medicine

## 2023-08-31 ENCOUNTER — Other Ambulatory Visit: Payer: Self-pay

## 2023-08-31 VITALS — BP 122/90 | HR 85 | Temp 98.5°F | Ht <= 58 in | Wt 160.2 lb

## 2023-08-31 DIAGNOSIS — M79605 Pain in left leg: Secondary | ICD-10-CM

## 2023-08-31 DIAGNOSIS — L299 Pruritus, unspecified: Secondary | ICD-10-CM

## 2023-08-31 DIAGNOSIS — B029 Zoster without complications: Secondary | ICD-10-CM

## 2023-08-31 DIAGNOSIS — R21 Rash and other nonspecific skin eruption: Secondary | ICD-10-CM

## 2023-08-31 MED ORDER — TRIAMCINOLONE ACETONIDE 0.1 % EX CREA
1.0000 | TOPICAL_CREAM | Freq: Two times a day (BID) | CUTANEOUS | 0 refills | Status: DC
Start: 2023-08-31 — End: 2024-01-02
  Filled 2023-08-31: qty 30, 15d supply, fill #0

## 2023-08-31 MED ORDER — VALACYCLOVIR HCL 1 G PO TABS
1000.0000 mg | ORAL_TABLET | Freq: Three times a day (TID) | ORAL | 0 refills | Status: AC
Start: 2023-08-31 — End: 2023-09-07
  Filled 2023-08-31: qty 21, 7d supply, fill #0

## 2023-08-31 MED ORDER — GABAPENTIN 300 MG PO CAPS
300.0000 mg | ORAL_CAPSULE | Freq: Three times a day (TID) | ORAL | 3 refills | Status: DC
Start: 2023-08-31 — End: 2024-01-02
  Filled 2023-08-31: qty 90, 30d supply, fill #0

## 2023-08-31 NOTE — Patient Instructions (Addendum)
 I have sent in triamcinolone cream for you.  You may use this twice a day.  I have sent Valtrex for you to take 1 tablet 3 times a day for 7 days.  Even if you are feeling better, please complete all of this medication.  I have sent in gabapentin for you to take 3 times a day as needed for pain.  Follow-up with me for new or worsening symptoms.

## 2023-08-31 NOTE — Progress Notes (Signed)
 Acute Office Visit  Subjective:     Patient ID: Michaela Finley, female    DOB: 1976/05/26, 48 y.o.   MRN: [id number]  Chief Complaint  Patient presents with   Acute Visit    Pelvic area break out rash, moving down thigh. Started on Saturday, spots are red, itchy. Has tried arnica gel    HPI Patient is in today for evaluation of rash to left groin and left thigh. In person Spanish interpreter, Rosanna present for visit. States that this started 3 days ago. Has never had a rash like this in the past. Has been using Arnica cream to the area with minimal relief. Reports that the rash looks like patches of blisters.  Denies any drainage from the areas. Reports that they are painful, itchy and burning. Reports generalized fatigue and malaise for the last couple of days as well.   ROS Per HPI      Objective:    BP (!) 122/90 (BP Location: Left Arm, Patient Position: Sitting)   Pulse 85   Temp 98.5 F (36.9 C) (Temporal)   Ht 4\' 10"  (1.473 m)   Wt 160 lb 3.2 oz (72.7 kg)   SpO2 99%   BMI 33.48 kg/m    Physical Exam Vitals and nursing note reviewed.  Constitutional:      Appearance: Normal appearance. She is obese.  HENT:     Head: Normocephalic and atraumatic.  Eyes:     Extraocular Movements: Extraocular movements intact.  Cardiovascular:     Rate and Rhythm: Normal rate.  Pulmonary:     Effort: Pulmonary effort is normal. No respiratory distress.  Musculoskeletal:        General: Normal range of motion.     Cervical back: Normal range of motion.  Lymphadenopathy:     Cervical: No cervical adenopathy.  Skin:    Findings: Erythema and rash present. Rash is purpuric and vesicular.          Comments: Erythematous, purpuric, vesicular rash noted to the left groin and left thigh at the areas above.  Tender to palpation, no discharge or draining noted.  Rash consistent with varicella-zoster  Neurological:     General: No focal deficit present.     Mental  Status: She is alert and oriented to person, place, and time.  Psychiatric:        Mood and Affect: Mood normal.        Thought Content: Thought content normal.    No results found for any visits on 08/31/23.      Assessment & Plan:  1. VZV (varicella-zoster virus) infection (Primary)  - valACYclovir (VALTREX) 1000 MG tablet; Take 1 tablet (1,000 mg total) by mouth 3 (three) times daily for 7 days.  Dispense: 21 tablet; Refill: 0 - triamcinolone cream (KENALOG) 0.1 %; Apply 1 Application topically 2 (two) times daily.  Dispense: 30 g; Refill: 0 - gabapentin (NEURONTIN) 300 MG capsule; Take 1 capsule (300 mg total) by mouth 3 (three) times daily.  Dispense: 90 capsule; Refill: 3  2. Rash  - triamcinolone cream (KENALOG) 0.1 %; Apply 1 Application topically 2 (two) times daily.  Dispense: 30 g; Refill: 0  3. Left leg pain  - gabapentin (NEURONTIN) 300 MG capsule; Take 1 capsule (300 mg total) by mouth 3 (three) times daily.  Dispense: 90 capsule; Refill: 3  4. Itching  - triamcinolone cream (KENALOG) 0.1 %; Apply 1 Application topically 2 (two) times daily.  Dispense: 30 g; Refill: 0  -  Education handout provided to patient in Spanish regarding VZV, medications, and when to f/u  Meds ordered this encounter  Medications   valACYclovir (VALTREX) 1000 MG tablet    Sig: Take 1 tablet (1,000 mg total) by mouth 3 (three) times daily for 7 days.    Dispense:  21 tablet    Refill:  0   triamcinolone cream (KENALOG) 0.1 %    Sig: Apply 1 Application topically 2 (two) times daily.    Dispense:  30 g    Refill:  0   gabapentin (NEURONTIN) 300 MG capsule    Sig: Take 1 capsule (300 mg total) by mouth 3 (three) times daily.    Dispense:  90 capsule    Refill:  3    Return if symptoms worsen or fail to improve.  Moshe Cipro, FNP

## 2023-09-06 ENCOUNTER — Other Ambulatory Visit (INDEPENDENT_AMBULATORY_CARE_PROVIDER_SITE_OTHER): Payer: Self-pay | Admitting: Primary Care

## 2023-09-06 DIAGNOSIS — B029 Zoster without complications: Secondary | ICD-10-CM

## 2023-09-06 DIAGNOSIS — R21 Rash and other nonspecific skin eruption: Secondary | ICD-10-CM

## 2023-09-06 DIAGNOSIS — L299 Pruritus, unspecified: Secondary | ICD-10-CM

## 2023-09-06 NOTE — Telephone Encounter (Signed)
 Requested medication (s) are due for refill today: review  Requested medication (s) are on the active medication list: yes  Last refill:  08/31/23  Future visit scheduled: yes  Notes to clinic:  not delegated     Requested Prescriptions  Pending Prescriptions Disp Refills   triamcinolone cream (KENALOG) 0.1 % 30 g 0    Sig: Apply 1 Application topically 2 (two) times daily.     Not Delegated - Dermatology:  Corticosteroids Failed - 09/06/2023  3:29 PM      Failed - This refill cannot be delegated      Passed - Valid encounter within last 12 months    Recent Outpatient Visits           2 months ago Cervical cancer screening   [facility] Grayce Sessions, NP   2 months ago New onset type 2 diabetes mellitus [facility])   [facility] - A Dept Of [place]. [facility] Lois Huxley, [place] L, RPH-CPP   3 months ago New onset type 2 diabetes mellitus [facility])   [facility] - A Dept Of [place]. [facility] Lois Huxley, [place] L, RPH-CPP   4 months ago New onset type 2 diabetes mellitus [facility])   [facility] - A Dept Of [place]. [facility] Lois Huxley, [place] L, RPH-CPP   6 months ago New onset type 2 diabetes mellitus [facility])   [facility] - A Dept Of [place]. [facility] Drucilla Chalet, RPH-CPP       Future Appointments             In 3 weeks Randa Evens, Kinnie Scales, NP [facility]

## 2023-09-06 NOTE — Telephone Encounter (Signed)
 Copied from CRM [id number]. Topic: Clinical - Medication Refill >> Sep 06, 2023  3:22 PM Eunice Blase wrote: Most Recent Primary Care Visit:  Provider: Moshe Cipro  Department: [facility]  Visit Type: ACUTE  Date: 08/31/2023  Medication: triamcinolone cream (KENALOG) 0.1 %  Has the patient contacted their pharmacy? Yes (Agent: If no, request that the patient contact the pharmacy for the refill. If patient does not wish to contact the pharmacy document the reason why and proceed with request.) (Agent: If yes, when and what did the pharmacy advise?)  Is this the correct pharmacy for this prescription? Yes If no, delete pharmacy and type the correct one.  This is the patient's preferred pharmacy:    [facility] [address] [place] [address] Phone: [phone] Fax: [phone]   Has the prescription been filled recently? Yes  Is the patient out of the medication? Yes  Has the patient been seen for an appointment in the last year OR does the patient have an upcoming appointment? Yes  Can we respond through MyChart? Yes  Agent: Please be advised that Rx refills may take up to 3 business days. We ask that you follow-up with your pharmacy.

## 2023-09-06 NOTE — Telephone Encounter (Signed)
 Requested medication (s) are due for refill today: review   Requested medication (s) are on the active medication list: yes   Last refill:  08/31/23   Future visit scheduled: yes   Notes to clinic:  not delegated   Apology sent to wrong pool previously.     Requested Prescriptions  Pending Prescriptions Disp Refills   triamcinolone cream (KENALOG) 0.1 % 30 g 0    Sig: Apply 1 Application topically 2 (two) times daily.     Not Delegated - Dermatology:  Corticosteroids Failed - 09/06/2023  3:43 PM      Failed - This refill cannot be delegated      Passed - Valid encounter within last 12 months    Recent Outpatient Visits           2 months ago Cervical cancer screening   [place] Grayce Sessions, NP   2 months ago New onset type 2 diabetes mellitus [facility])   [facility] - A Dept Of [place]. [facility] Lois Huxley, [place] L, RPH-CPP   3 months ago New onset type 2 diabetes mellitus [facility])   [facility] - A Dept Of [place]. [facility] Lois Huxley, [place] L, RPH-CPP   4 months ago New onset type 2 diabetes mellitus [facility])   [facility] [facility] - A Dept Of [place]. [facility] Lois Huxley, [place] L, RPH-CPP   6 months ago New onset type 2 diabetes mellitus [facility])   [facility] - A Dept Of . [facility] Drucilla Chalet, RPH-CPP       Future Appointments             In 3 weeks Randa Evens, Kinnie Scales, NP [facility] [facility]

## 2023-09-08 ENCOUNTER — Other Ambulatory Visit: Payer: Self-pay | Admitting: Family Medicine

## 2023-09-08 ENCOUNTER — Other Ambulatory Visit: Payer: Self-pay

## 2023-09-08 DIAGNOSIS — L299 Pruritus, unspecified: Secondary | ICD-10-CM

## 2023-09-08 DIAGNOSIS — B029 Zoster without complications: Secondary | ICD-10-CM

## 2023-09-08 DIAGNOSIS — R21 Rash and other nonspecific skin eruption: Secondary | ICD-10-CM

## 2023-09-13 ENCOUNTER — Other Ambulatory Visit: Payer: Self-pay

## 2023-09-29 ENCOUNTER — Ambulatory Visit (INDEPENDENT_AMBULATORY_CARE_PROVIDER_SITE_OTHER): Payer: Self-pay | Admitting: Primary Care

## 2023-10-03 ENCOUNTER — Ambulatory Visit (INDEPENDENT_AMBULATORY_CARE_PROVIDER_SITE_OTHER): Payer: Self-pay | Admitting: Primary Care

## 2023-10-03 ENCOUNTER — Encounter (INDEPENDENT_AMBULATORY_CARE_PROVIDER_SITE_OTHER): Payer: Self-pay | Admitting: Primary Care

## 2023-10-03 VITALS — BP 114/72 | HR 76 | Ht <= 58 in | Wt 162.2 lb

## 2023-10-03 DIAGNOSIS — Z1211 Encounter for screening for malignant neoplasm of colon: Secondary | ICD-10-CM

## 2023-10-03 DIAGNOSIS — Z6833 Body mass index (BMI) 33.0-33.9, adult: Secondary | ICD-10-CM

## 2023-10-03 DIAGNOSIS — E782 Mixed hyperlipidemia: Secondary | ICD-10-CM

## 2023-10-03 DIAGNOSIS — E559 Vitamin D deficiency, unspecified: Secondary | ICD-10-CM

## 2023-10-03 DIAGNOSIS — E6609 Other obesity due to excess calories: Secondary | ICD-10-CM

## 2023-10-03 DIAGNOSIS — E66811 Obesity, class 1: Secondary | ICD-10-CM

## 2023-10-03 DIAGNOSIS — E119 Type 2 diabetes mellitus without complications: Secondary | ICD-10-CM

## 2023-10-03 DIAGNOSIS — R748 Abnormal levels of other serum enzymes: Secondary | ICD-10-CM

## 2023-10-03 NOTE — Patient Instructions (Addendum)
Obesidad en los adultos Obesity, Adult La obesidad es un exceso de Art gallery manager. Ser obeso significa que su peso es ms alto de lo que es saludable para usted.  El IMC (ndice de masa muscular) es un nmero que indica la cantidad de grasa corporal que tiene una persona. Si usted tiene un ndice de masa corporal (IMC) de 30 o ms, esto significa que es obeso. La obesidad puede causar problemas de salud graves, como los siguientes: Accidente cerebrovascular. Arteriopata coronaria (EAC). Diabetes tipo 2. Algunos tipos de cncer. Presin arterial alta (hipertensin arterial). Colesterol alto. Clculos en la vescula biliar. La obesidad tambin puede contribuir a lo siguiente: Artrosis. Apnea del sueo. Problemas de esterilidad. Cules son las causas? Consumir todos los [employer] con altos niveles de [place], International aid/development worker y [place]. Beber gran cantidad de bebidas con azcar. Nacer con genes que pueden hacerlo ms propenso a ser obeso. Tener una afeccin mdica que causa obesidad. Tomar ciertos medicamentos. Permanecer mucho tiempo sentado (tener un estilo de vida sedentario). No dormir lo suficiente. Qu incrementa el riesgo? Tener antecedentes familiares de obesidad. Vivir en un rea con acceso limitado a las siguientes posibilidades: Parques, centros recreativos o veredas. Alimentos saludables, como se venden en tiendas de comestibles y mercados de Event organiser. Cules son los signos o sntomas? El principal signo es tener demasiada grasa corporal. Cmo se trata? El tratamiento de esta afeccin frecuentemente incluye cambiar el estilo de vida. El tratamiento puede incluir: Cambios en la dieta. Esto puede incluir crear un plan de alimentacin saludable. Realizar actividad fsica. Puede incluir una actividad que hace que el corazn lata ms rpido (ejercicio [place]) y Fish farm manager de Pensions consultant. Trabaje con su mdico para disear un programa que funcione para usted. Medicamentos  para ayudarlo a Curator. Pueden utilizarse si no puede perder una libra por semana despus de 6 semanas de alimentacin saludable y ms ejercicio. Tratar las afecciones que causan la obesidad. Ciruga. Las opciones pueden incluir bandas gstricas y bypass gstrico. Esto puede realizarse en las siguientes situaciones: Otros tratamientos no mejoraron su afeccin. Tiene un IMC de 40 o superior. Tiene problemas de salud potencialmente mortales relacionados con la obesidad. Siga estas indicaciones en su casa: Comida y bebida  Siga las instrucciones del mdico respecto de las comidas y las bebidas. Su mdico puede recomendarle lo siguiente: Limitar las comidas rpidas, los dulces y las colaciones procesadas. Elegir opciones con bajo contenido de [place]. Por ejemplo, Counsellor de [employer]. Consumir cinco o ms porciones de frutas o verduras por da. Comer en casa con ms frecuencia. Esto le da ms control sobre lo que come. Elegir alimentos saludables cuando coma afuera. Aprender a leer las etiquetas de los alimentos. Esto le ayudar a aprender qu cantidad de alimento hay en una porcin. Tener a mano colaciones con bajo contenido de [place]. Evitar las bebidas que contengan mucha azcar. Estas incluyen refrescos, jugo de frutas, t helado con azcar y [place] saborizada. Beba suficiente agua para mantener el pis (la [place]) de color amarillo plido. No siga las dietas de [place]. Actividad fsica Haga ejercicios con frecuencia, como se lo haya indicado el mdico. La mayora de los adultos deben hacer hasta 150 minutos de ejercicio de intensidad moderada cada semana.Pregntele al mdico lo siguiente: Los tipos de ejercicios que son seguros para usted. La frecuencia con la que [employer] ejercicios. Precaliente y elongue adecuadamente antes de hacer actividad fsica. Haga un estiramiento lento despus de la actividad (relajacin). Descanse entre los perodos de  actividad. Estilo de vida Trabaje con su mdico y con un experto en alimentacin (nutricionista) para establecer un objetivo de prdida de peso que sea adecuado para usted. Limite el tiempo que pasa frente a una pantalla. Busque formas de recompensarse que no incluyan alimentos. No beba alcohol si: El mdico le indica que no lo haga. Est embarazada, puede estar embarazada o est tratando de [place]. Si bebe alcohol: Limite la cantidad que bebe a lo siguiente: De 0 a 1 medida por da para las mujeres. De 0 a 2 medidas por da para los hombres. Sepa cunta cantidad de alcohol hay en las bebidas que toma. En los [address], una medida equivale a una botella de cerveza de 12 oz (355 ml), un vaso de vino de 5 oz (148 ml) o un vaso de una bebida alcohlica de alta graduacin de 1 oz (44 ml). Indicaciones generales Lleve un diario de su prdida de peso. Esto puede ayudarlo a Youth worker de lo siguiente: Los alimentos que come. Cunto ejercicio realiza. Use los medicamentos de venta libre y los recetados solamente como se lo haya indicado el mdico. Tome vitaminas y suplementos solamente como se lo haya indicado el mdico. Considere participar en un grupo de apoyo. Preste atencin a Radiographer, therapeutic mental, ya que la obesidad puede provocar depresin o problemas de [place]. Concurra a todas las visitas de seguimiento. Comunquese con un mdico si: No puede alcanzar su objetivo de prdida de peso despus de haber modificado su dieta y su estilo de vida durante 6 semanas. Presenta dificultades respiratorias sbitas. Resumen La obesidad es un exceso de Art gallery manager. Ser obeso significa que su peso es ms alto de lo que es saludable para usted. Trabaje con su mdico para establecer un objetivo de prdida de [place]. Haga actividad fsica con regularidad tal como le indic el mdico. Esta informacin no tiene Theme park manager el consejo del mdico. Asegrese de hacerle al  mdico cualquier pregunta que tenga. Document Revised: 02/19/2021 Document Reviewed: 02/19/2021 Elsevier Patient Education  2024 [employer].

## 2023-10-03 NOTE — Progress Notes (Signed)
 [facility]  Michaela Finley, is a 48 y.o. female  ZOX:[id number]  [id number]  DOB - Oct 09, 1975  Chief Complaint  Patient presents with   Medical Management of Chronic Issues       Subjective:   Michaela Finley is a 48 y.o. Hispanic obese female (interpreter Michaela Finley [phone]) she is here today for a follow up visit for her diabetes.  She voices concern that in the morning her blood sugars range around 80 and throughout the day they are greater than 200.  Previous A1c was 6.3. She denies polyuria, polydipsia, polyphasia or vision changes.  Does check blood sugars at home.  Patient has No headache, No chest pain, No abdominal pain - No Nausea, No new weakness tingling or numbness, No Cough - shortness of breath No problems updated.  Comprehensive ROS Pertinent positive and negative noted in HPI   No Known Allergies  No past medical history on file.  Current Outpatient Medications on File Prior to Visit  Medication Sig Dispense Refill   Blood Glucose Monitoring Suppl (TRUE METRIX METER) w/Device KIT Use to check blood sugar three times daily. 1 kit 0   Dulaglutide (TRULICITY) 1.5 MG/0.5ML SOAJ Inject 1.5 mg into the skin once a week. 6 mL 1   ergocalciferol (VITAMIN D2) 1.25 MG (50000 UT) capsule Take 1 capsule (50,000 Units total) by mouth once a week. 8 capsule 0   gabapentin (NEURONTIN) 300 MG capsule Take 1 capsule (300 mg total) by mouth 3 (three) times daily. 90 capsule 3   glucose blood (TRUE METRIX BLOOD GLUCOSE TEST) test strip Use to check blood sugar three times daily. 100 each 6   ibuprofen (ADVIL,MOTRIN) 600 MG tablet Take 1 tablet (600 mg total) by mouth every 8 (eight) hours as needed (Take with food.). 30 tablet 0   Insulin Pen Needle (PEN NEEDLES) 32G X 4 MM MISC Use to inject Basaglar once daily. 100 each 3   metFORMIN (GLUCOPHAGE-XR) 500 MG 24 hr tablet Take 1 tablet (500 mg total) by mouth daily with breakfast. 90 tablet 1   metroNIDAZOLE  (FLAGYL) 500 MG tablet Take 1 tablet (500 mg total) by mouth 2 (two) times daily. 14 tablet 0   triamcinolone cream (KENALOG) 0.1 % Apply 1 Application topically 2 (two) times daily. 30 g 0   TRUEplus Lancets 28G MISC Use to check blood sugar three times daily. 100 each 6   No current facility-administered medications on file prior to visit.   Health Maintenance  Topic Date Due   COVID-19 Vaccine (1) Never done   Pneumococcal Vaccination (1 of 2 - PCV) Never done   Yearly kidney health urinalysis for diabetes  Never done   DTaP/Tdap/Td vaccine (1 - Tdap) Never done   Colon Cancer Screening  Never done   Flu Shot  10/03/2023*   Yearly kidney function blood test for diabetes  02/03/2024   Pap with HPV screening  06/29/2026   Hepatitis C Screening  Completed   HIV Screening  Completed   HPV Vaccine  Aged Out  *Topic was postponed. The date shown is not the original due date.    Objective:   Vitals:   10/03/23 1603 10/03/23 1604  BP:  114/72  Pulse:  76  SpO2:  96%  Weight: 162 lb 3.2 oz (73.6 kg) 162 lb 3.2 oz (73.6 kg)  Height: 4\' 10"  (1.473 m)     Physical Exam General: No apparent distress. Obese female  Eyes: Extraocular eye movements intact, pupils equal  and round. Neck: Supple, trachea midline. Thyroid: No enlargement, mobile without fixation, no tenderness. Cardiovascular: Regular rhythm and rate, no murmur, normal radial pulses. Respiratory: Normal respiratory effort, clear to auscultation. Gastrointestinal: Normal pitch active bowel sounds, nontender abdomen without distention or appreciable hepatomegaly. Musculoskeletal: Normal muscle tone, no tenderness on palpation of tibia, no excessive thoracic kyphosis. Skin: Appropriate warmth, no visible rash. Mental status: Alert, conversant, speech clear, thought logical, appropriate mood and affect, no hallucinations or delusions evident. Hematologic/lymphatic: No cervical adenopathy, no visible ecchymoses.   Assessment &  Plan  Michaela Finley was seen today for medical management of chronic issues.  Diagnoses and all orders for this visit:  Type 2 diabetes mellitus without complication, without long-term current use of insulin (HCC) -     Microalbumin / creatinine urine ratio -     HgB A1c -     CBC with Differential  Mixed hyperlipidemia -     Lipid Panel  Vitamin D deficiency -     Vitamin D, 25-hydroxy  Elevated liver enzymes -     CMP14+EGFR  Colon cancer screening -     Fecal occult blood, imunochemical; Future    Class 1 obesity due to excess calories without serious comorbidity with body mass index (BMI) of 33.0 to 33.9 in adult  Obesity is 30-39 indicating an excess in caloric intake or underlining conditions. This may lead to other co-morbidities. Educated on lifestyle modifications of diet and exercise which may reduce obesity.    Patient have been counseled extensively about nutrition and exercise. Other issues discussed during this visit include: low cholesterol diet, weight control and daily exercise, foot care, annual eye examinations at Ophthalmology, importance of adherence with medications and regular follow-up. We also discussed long term complications of uncontrolled diabetes and hypertension.   Return in about 3 months (around 01/02/2024) for fasting labs.  The patient was given clear instructions to go to ER or return to medical center if symptoms don't improve, worsen or new problems develop. The patient verbalized understanding. The patient was told to call to get lab results if they haven't heard anything in the next week.   This note has been created with Education officer, environmental. Any transcriptional errors are unintentional.   Grayce Sessions, NP 10/03/2023, 4:53 PM

## 2023-10-04 ENCOUNTER — Other Ambulatory Visit (INDEPENDENT_AMBULATORY_CARE_PROVIDER_SITE_OTHER): Payer: Self-pay

## 2023-10-05 LAB — CBC WITH DIFFERENTIAL/PLATELET
Basophils Absolute: 0.1 10*3/uL (ref 0.0–0.2)
Basos: 1 %
EOS (ABSOLUTE): 0.2 10*3/uL (ref 0.0–0.4)
Eos: 3 %
Hematocrit: 41.4 % (ref 34.0–46.6)
Hemoglobin: 13.9 g/dL (ref 11.1–15.9)
Immature Grans (Abs): 0 10*3/uL (ref 0.0–0.1)
Immature Granulocytes: 0 %
Lymphocytes Absolute: 2.4 10*3/uL (ref 0.7–3.1)
Lymphs: 44 %
MCH: 30.2 pg (ref 26.6–33.0)
MCHC: 33.6 g/dL (ref 31.5–35.7)
MCV: 90 fL (ref 79–97)
Monocytes Absolute: 0.3 10*3/uL (ref 0.1–0.9)
Monocytes: 5 %
Neutrophils Absolute: 2.5 10*3/uL (ref 1.4–7.0)
Neutrophils: 47 %
Platelets: 228 10*3/uL (ref 150–450)
RBC: 4.61 x10E6/uL (ref 3.77–5.28)
RDW: 13.5 % (ref 11.7–15.4)
WBC: 5.3 10*3/uL (ref 3.4–10.8)

## 2023-10-05 LAB — CMP14+EGFR
ALT: 54 IU/L — ABNORMAL HIGH (ref 0–32)
AST: 35 IU/L (ref 0–40)
Albumin: 4.1 g/dL (ref 3.9–4.9)
Alkaline Phosphatase: 111 IU/L (ref 44–121)
BUN/Creatinine Ratio: 17 (ref 9–23)
BUN: 10 mg/dL (ref 6–24)
Bilirubin Total: 0.3 mg/dL (ref 0.0–1.2)
CO2: 23 mmol/L (ref 20–29)
Calcium: 9 mg/dL (ref 8.7–10.2)
Chloride: 102 mmol/L (ref 96–106)
Creatinine, Ser: 0.59 mg/dL (ref 0.57–1.00)
Globulin, Total: 3.2 g/dL (ref 1.5–4.5)
Glucose: 92 mg/dL (ref 70–99)
Potassium: 4.5 mmol/L (ref 3.5–5.2)
Sodium: 137 mmol/L (ref 134–144)
Total Protein: 7.3 g/dL (ref 6.0–8.5)
eGFR: 112 mL/min/{1.73_m2} (ref >59)

## 2023-10-05 LAB — MICROALBUMIN / CREATININE URINE RATIO

## 2023-10-05 LAB — LIPID PANEL
Chol/HDL Ratio: 5 ratio — ABNORMAL HIGH (ref 0.0–4.4)
Cholesterol, Total: 129 mg/dL (ref 100–199)
HDL: 26 mg/dL — ABNORMAL LOW (ref >39)
LDL Chol Calc (NIH): 78 mg/dL (ref 0–99)
Triglycerides: 143 mg/dL (ref 0–149)
VLDL Cholesterol Cal: 25 mg/dL (ref 5–40)

## 2023-10-05 LAB — VITAMIN D 25 HYDROXY (VIT D DEFICIENCY, FRACTURES): Vit D, 25-Hydroxy: 16.5 ng/mL — ABNORMAL LOW (ref 30.0–100.0)

## 2023-10-06 ENCOUNTER — Other Ambulatory Visit (INDEPENDENT_AMBULATORY_CARE_PROVIDER_SITE_OTHER): Payer: Self-pay | Admitting: *Deleted

## 2023-10-06 DIAGNOSIS — E559 Vitamin D deficiency, unspecified: Secondary | ICD-10-CM

## 2023-10-06 DIAGNOSIS — E119 Type 2 diabetes mellitus without complications: Secondary | ICD-10-CM

## 2023-10-06 NOTE — Telephone Encounter (Signed)
 Due to language barrier, an interpreter was used.  Interpreter name or [address] [place] #--[id number], Reason for encounter--labs  Patient made aware.    Copied from CRM [id number]. Topic: Clinical - Request for Lab/Test Order >> Oct 05, 2023  2:53 PM Dondra Prader E wrote: Reason for CRM: Jasmine December from [employer] called to report that they received a urine sample bottle that does not have the patient's full name and DOB. They are resending the request to have this recollected.

## 2023-10-07 LAB — MICROALBUMIN / CREATININE URINE RATIO
Creatinine, Urine: 87.5 mg/dL
Microalb/Creat Ratio: 671 mg/g{creat} — ABNORMAL HIGH (ref 0–29)
Microalbumin, Urine: 587.5 ug/mL

## 2023-10-07 LAB — SPECIMEN STATUS REPORT

## 2023-10-11 ENCOUNTER — Other Ambulatory Visit: Payer: Self-pay

## 2023-10-11 ENCOUNTER — Other Ambulatory Visit (INDEPENDENT_AMBULATORY_CARE_PROVIDER_SITE_OTHER): Payer: Self-pay | Admitting: Primary Care

## 2023-10-11 DIAGNOSIS — E559 Vitamin D deficiency, unspecified: Secondary | ICD-10-CM

## 2023-10-11 MED ORDER — ERGOCALCIFEROL 1.25 MG (50000 UT) PO CAPS
50000.0000 [IU] | ORAL_CAPSULE | ORAL | 0 refills | Status: DC
  Filled 2023-10-11: qty 4, 28d supply, fill #0
  Filled 2023-11-17: qty 4, 28d supply, fill #1

## 2023-10-14 ENCOUNTER — Other Ambulatory Visit: Payer: Self-pay

## 2023-11-17 ENCOUNTER — Other Ambulatory Visit: Payer: Self-pay

## 2023-11-17 ENCOUNTER — Other Ambulatory Visit: Payer: Self-pay | Admitting: Family Medicine

## 2023-11-17 DIAGNOSIS — E119 Type 2 diabetes mellitus without complications: Secondary | ICD-10-CM

## 2023-11-17 MED ORDER — METFORMIN HCL ER 500 MG PO TB24
500.0000 mg | ORAL_TABLET | Freq: Every day | ORAL | 1 refills | Status: DC
  Filled 2023-11-17 – 2023-12-07 (×3): qty 90, 90d supply, fill #0

## 2023-11-29 ENCOUNTER — Other Ambulatory Visit: Payer: Self-pay

## 2023-12-07 ENCOUNTER — Other Ambulatory Visit (INDEPENDENT_AMBULATORY_CARE_PROVIDER_SITE_OTHER): Payer: Self-pay | Admitting: Primary Care

## 2023-12-07 ENCOUNTER — Other Ambulatory Visit: Payer: Self-pay

## 2023-12-07 ENCOUNTER — Other Ambulatory Visit (HOSPITAL_COMMUNITY): Payer: Self-pay

## 2023-12-07 DIAGNOSIS — E559 Vitamin D deficiency, unspecified: Secondary | ICD-10-CM

## 2023-12-08 NOTE — Telephone Encounter (Signed)
 Requested medications are due for refill today.  yes  Requested medications are on the active medications list.  yes  Last refill. 10/11/2023 #8 0 rf  Future visit scheduled.   yes  Notes to clinic.  Provider to review at this dosage.    Requested Prescriptions  Pending Prescriptions Disp Refills   ergocalciferol  (VITAMIN D2) 1.25 MG (50000 UT) capsule 8 capsule 0    Sig: Take 1 capsule (50,000 Units total) by mouth once a week.     Endocrinology:  Vitamins - Vitamin D  Supplementation 2 Failed - 12/08/2023  4:50 PM      Failed - Manual Review: Route requests for 50,000 IU strength to the provider      Failed - Vitamin D  in normal range and within 360 days    Vit D, 25-Hydroxy  Date Value Ref Range Status  10/03/2023 16.5 (L) 30.0 - 100.0 ng/mL Final    Comment:    Vitamin D  deficiency has been defined by the Institute of Medicine and an Endocrine Society practice guideline as a level of serum 25-OH vitamin D  less than 20 ng/mL (1,2). The Endocrine Society went on to further define vitamin D  insufficiency as a level between 21 and 29 ng/mL (2). 1. IOM (Institute of Medicine). 2010. Dietary reference    intakes for calcium  and D. [place]: The    [employer]. 2. Holick MF, Binkley Glass[place]or, Bischoff-Ferrari HA, et al.    Evaluation, treatment, and prevention of vitamin D     deficiency: an Endocrine Society clinical practice    guideline. JCEM. 2011 Jul; 96(7):1911-30.          Passed - Ca in normal range and within 360 days    Calcium   Date Value Ref Range Status  10/03/2023 9.0 8.7 - 10.2 mg/dL Final         Passed - Valid encounter within last 12 months    Recent Outpatient Visits           2 months ago Type 2 diabetes mellitus without complication, without long-term current use of insulin  [facility])   [place] [facility] Marius Siemens, NP   5 months ago Cervical cancer screening   [place] [facility] Marius Siemens, NP   5 months ago New onset type 2 diabetes mellitus [facility])   [facility] - A Dept Of [place]. [facility] Freada Jacobs, [place] L, RPH-CPP   7 months ago New onset type 2 diabetes mellitus [facility])   [place] [facility] - A Dept Of [place]. [facility] Freada Jacobs, [place] L, RPH-CPP   8 months ago New onset type 2 diabetes mellitus [facility])    [facility] - A Dept Of [place]. [facility] Valente Gaskin, RPH-CPP

## 2023-12-14 ENCOUNTER — Other Ambulatory Visit: Payer: Self-pay

## 2024-01-02 ENCOUNTER — Ambulatory Visit (INDEPENDENT_AMBULATORY_CARE_PROVIDER_SITE_OTHER): Payer: Self-pay | Admitting: Primary Care

## 2024-01-02 ENCOUNTER — Other Ambulatory Visit: Payer: Self-pay

## 2024-01-02 ENCOUNTER — Encounter (INDEPENDENT_AMBULATORY_CARE_PROVIDER_SITE_OTHER): Payer: Self-pay | Admitting: Primary Care

## 2024-01-02 VITALS — BP 115/80 | HR 86 | Resp 16 | Wt 160.2 lb

## 2024-01-02 DIAGNOSIS — Z1211 Encounter for screening for malignant neoplasm of colon: Secondary | ICD-10-CM

## 2024-01-02 DIAGNOSIS — B029 Zoster without complications: Secondary | ICD-10-CM

## 2024-01-02 DIAGNOSIS — Z7985 Long-term (current) use of injectable non-insulin antidiabetic drugs: Secondary | ICD-10-CM

## 2024-01-02 DIAGNOSIS — Z2821 Immunization not carried out because of patient refusal: Secondary | ICD-10-CM

## 2024-01-02 DIAGNOSIS — L299 Pruritus, unspecified: Secondary | ICD-10-CM

## 2024-01-02 DIAGNOSIS — E119 Type 2 diabetes mellitus without complications: Secondary | ICD-10-CM

## 2024-01-02 DIAGNOSIS — R21 Rash and other nonspecific skin eruption: Secondary | ICD-10-CM

## 2024-01-02 DIAGNOSIS — M25562 Pain in left knee: Secondary | ICD-10-CM

## 2024-01-02 DIAGNOSIS — E782 Mixed hyperlipidemia: Secondary | ICD-10-CM

## 2024-01-02 DIAGNOSIS — G8929 Other chronic pain: Secondary | ICD-10-CM

## 2024-01-02 DIAGNOSIS — R748 Abnormal levels of other serum enzymes: Secondary | ICD-10-CM

## 2024-01-02 DIAGNOSIS — M79605 Pain in left leg: Secondary | ICD-10-CM

## 2024-01-02 DIAGNOSIS — Z7984 Long term (current) use of oral hypoglycemic drugs: Secondary | ICD-10-CM

## 2024-01-02 MED ORDER — TRIAMCINOLONE ACETONIDE 0.1 % EX CREA
1.0000 | TOPICAL_CREAM | Freq: Two times a day (BID) | CUTANEOUS | 0 refills | Status: AC
Start: 2024-01-02 — End: ?

## 2024-01-02 MED ORDER — TRUE METRIX BLOOD GLUCOSE TEST VI STRP
ORAL_STRIP | 6 refills | Status: DC
  Filled 2024-01-02: qty 100, 30d supply, fill #0

## 2024-01-02 MED ORDER — IBUPROFEN 600 MG PO TABS
600.0000 mg | ORAL_TABLET | Freq: Three times a day (TID) | ORAL | 0 refills | Status: DC | PRN
  Filled 2024-01-02: qty 30, 10d supply, fill #0

## 2024-01-02 MED ORDER — GABAPENTIN 300 MG PO CAPS
300.0000 mg | ORAL_CAPSULE | Freq: Three times a day (TID) | ORAL | 3 refills | Status: DC
  Filled 2024-01-02: qty 90, 30d supply, fill #0

## 2024-01-02 MED ORDER — METFORMIN HCL ER 500 MG PO TB24
500.0000 mg | ORAL_TABLET | Freq: Every day | ORAL | 1 refills | Status: AC
  Filled 2024-01-02 – 2024-03-06 (×3): qty 90, 90d supply, fill #0
  Filled 2024-06-19: qty 90, 90d supply, fill #1

## 2024-01-02 MED ORDER — VITAMIN D3 50 MCG (2000 UT) PO CAPS
2000.0000 [IU] | ORAL_CAPSULE | Freq: Every day | ORAL | 1 refills | Status: DC
  Filled 2024-01-02: qty 30, 30d supply, fill #0
  Filled 2024-02-06: qty 30, 30d supply, fill #1
  Filled 2024-03-15: qty 30, 30d supply, fill #2
  Filled 2024-04-12: qty 30, 30d supply, fill #3
  Filled 2024-05-22: qty 30, 30d supply, fill #4
  Filled 2024-06-19: qty 30, 30d supply, fill #5

## 2024-01-02 MED ORDER — TRULICITY 1.5 MG/0.5ML ~~LOC~~ SOAJ
1.5000 mg | SUBCUTANEOUS | 1 refills | Status: AC
  Filled 2024-01-02: qty 6, 84d supply, fill #0
  Filled 2024-01-02: qty 2, 28d supply, fill #0
  Filled 2024-02-06: qty 2, 28d supply, fill #1
  Filled 2024-03-06: qty 2, 28d supply, fill #2
  Filled 2024-04-17: qty 2, 28d supply, fill #3
  Filled 2024-05-22: qty 2, 28d supply, fill #4
  Filled 2024-06-19: qty 2, 28d supply, fill #5

## 2024-01-02 NOTE — Patient Instructions (Signed)
 Las gotas oftlmicas antihistamnicas se utilizan para Paramedic los sntomas de la alergia ocular, como picazn, enrojecimiento y lagrimeo. Actan bloqueando la histamina, una sustancia qumica que el cuerpo libera durante una reaccin Counselling psychologist. Entre las opciones comunes de venta libre se incluyen ketotifeno ([place], Zaditor) y olopatadina (Pataday). Entre las opciones con receta se incluyen [place], [place] y [place].  Cmo funcionan: Antihistamnicos  bloquean los receptores de histamina en los ojos, impidiendo que la histamina desencadene los sntomas de la [place].  Estabilizadores de Landscape architect  (a menudo combinados con antihistamnicos) impiden que los mastocitos liberen histamina.  Ingredientes y Sports coach comunes: Ketotifeno: Engineer, production y Zaditor. Olopatadina: Presente en [place], [place]. Azelastina: Opcin con receta. Otras marcas: Clear Eyes Once Daily Eye Allergy Itch Relief, [place]. Combinaciones de descongestionantes y antihistamnicos: Visine-A, Opcon-A. Cundo usar: Para ojos con picazn, lagrimeo y enrojecimiento causados por alergias. Para prevenir los sntomas antes de la exposicin a alrgenos (p. ej., antes de trabajar en el jardn). Consideraciones importantes:

## 2024-01-02 NOTE — Progress Notes (Signed)
 [facility]  Tiyah Zelenak, is a 48 y.o. female  RDW:[id number]  FMW:[id number]  DOB - 03/07/1976  Chief Complaint  Patient presents with   Diabetes   Hyperlipidemia       Subjective:   Ms.Shervon Prieto-Soto is a 48 y.o. Hispanic female  ( interpreter Emile  [phone])  here today for an acute visit. Management of diabetes -Denies polyuria, polydipsia, polyphasia or vision changes.  Does 120- 130  check blood sugars at home.   Diabetes  Hyperlipidemia  Rash The current episode started 1 to 4 weeks ago. The problem has been gradually worsening since onset. The affected locations include the left hand, left wrist, left eye, right eye, right arm, right elbow, left arm, left elbow and left foot (peeling skin). The rash is characterized by dryness and redness. She was exposed to nothing. Associated symptoms include eye pain. Past treatments include topical steroids. The treatment provided mild relief.    No problems updated.  Comprehensive ROS Pertinent positive and negative noted in HPI   No Known Allergies  History reviewed. No pertinent past medical history.  Current Outpatient Medications on File Prior to Visit  Medication Sig Dispense Refill   Blood Glucose Monitoring Suppl (TRUE METRIX METER) w/Device KIT Use to check blood sugar three times daily. 1 kit 0   ergocalciferol  (VITAMIN D2) 1.25 MG (50000 UT) capsule Take 1 capsule (50,000 Units total) by mouth once a week. 8 capsule 0   Insulin  Pen Needle (PEN NEEDLES) 32G X 4 MM MISC Use to inject Basaglar  once daily. 100 each 3   TRUEplus Lancets 28G MISC Use to check blood sugar three times daily. 100 each 6   No current facility-administered medications on file prior to visit.   Health Maintenance  Topic Date Due   COVID-19 Vaccine (1) Never done   Hepatitis B Vaccine (1 of 3 - 19+ 3-dose series) Never done   Colon Cancer Screening  Never done   DTaP/Tdap/Td vaccine (1 - Tdap) 01/01/2025*    Pneumococcal Vaccination (1 of 2 - PCV) 01/01/2025*   Flu Shot  02/03/2024   Yearly kidney function blood test for diabetes  10/02/2024   Yearly kidney health urinalysis for diabetes  10/05/2024   Pap with HPV screening  06/29/2026   Hepatitis C Screening  Completed   HIV Screening  Completed   HPV Vaccine  Aged Out   Meningitis B Vaccine  Aged Out  *Topic was postponed. The date shown is not the original due date.    Objective:   Vitals:   01/02/24 1032  BP: 115/80  Pulse: 86  Resp: 16  SpO2: 98%  Weight: 160 lb 3.2 oz (72.7 kg)   BP Readings from Last 3 Encounters:  01/02/24 115/80  10/03/23 114/72  08/31/23 (!) 122/90      Physical Exam   Assessment & Plan  Vlada was seen today for diabetes and hyperlipidemia.  Diagnoses and all orders for this visit:  Tetanus, diphtheria, and acellular pertussis (Tdap) vaccination declined  Mixed hyperlipidemia -     Lipid Panel  Elevated liver enzymes -     CMP14+EGFR  Type 2 diabetes mellitus without complication, without long-term current use of insulin  (HCC) -     CBC with Differential -     Hemoglobin A1c  Pneumococcal vaccination declined  New onset type 2 diabetes mellitus (HCC) - educated on lifestyle modifications, including but not limited to diet choices and adding exercise to daily routine.     -  Dulaglutide  (TRULICITY ) 1.5 MG/0.5ML SOAJ; Inject 1.5 mg into the skin once a week. -     glucose blood (TRUE METRIX BLOOD GLUCOSE TEST) test strip; Use to check blood sugar three times daily. -     metFORMIN  (GLUCOPHAGE -XR) 500 MG 24 hr tablet; Take 1 tablet (500 mg total) by mouth daily with breakfast.  VZV (varicella-zoster virus) infection -     gabapentin  (NEURONTIN ) 300 MG capsule; Take 1 capsule (300 mg total) by mouth 3 (three) times daily. -     triamcinolone  cream (KENALOG ) 0.1 %; Apply 1 Application topically 2 (two) times daily.  Left leg pain -     gabapentin  (NEURONTIN ) 300 MG capsule; Take 1  capsule (300 mg total) by mouth 3 (three) times daily.  Chronic pain of left knee -     ibuprofen  (ADVIL ) 600 MG tablet; Take 1 tablet (600 mg total) by mouth every 8 (eight) hours as needed (Take with food.).  Itching 2/2 Rash -     triamcinolone  cream (KENALOG ) 0.1 %; Apply 1 Application topically 2 (two) times daily.  Other orders -     Cholecalciferol (VITAMIN D3) 50 MCG (2000 UT) capsule; Take 1 capsule (2,000 Units total) by mouth daily.   Colon cancer screening   GI referral   Patient have been counseled extensively about nutrition and exercise. Other issues discussed during this visit include: low cholesterol diet, weight control and daily exercise, foot care, annual eye examinations at Ophthalmology, importance of adherence with medications and regular follow-up. We also discussed long term complications of uncontrolled diabetes and hypertension.   Return in about 6 months (around 07/03/2024).  The patient was given clear instructions to go to ER or return to medical center if symptoms don't improve, worsen or new problems develop. The patient verbalized understanding. The patient was told to call to get lab results if they haven't heard anything in the next week.   This note has been created with Education officer, environmental. Any transcriptional errors are unintentional.   Rosaline SHAUNNA Bohr, NP 01/02/2024, 11:25 AM

## 2024-01-09 ENCOUNTER — Ambulatory Visit (INDEPENDENT_AMBULATORY_CARE_PROVIDER_SITE_OTHER): Payer: Self-pay

## 2024-01-09 DIAGNOSIS — E782 Mixed hyperlipidemia: Secondary | ICD-10-CM

## 2024-01-09 DIAGNOSIS — E119 Type 2 diabetes mellitus without complications: Secondary | ICD-10-CM

## 2024-01-09 NOTE — Progress Notes (Signed)
 Pt came into the office for lab work

## 2024-01-10 LAB — CMP14+EGFR
ALT: 42 IU/L — ABNORMAL HIGH (ref 0–32)
AST: 27 IU/L (ref 0–40)
Albumin: 4.4 g/dL (ref 3.9–4.9)
Alkaline Phosphatase: 102 IU/L (ref 44–121)
BUN/Creatinine Ratio: 18 (ref 9–23)
BUN: 11 mg/dL (ref 6–24)
Bilirubin Total: 0.4 mg/dL (ref 0.0–1.2)
CO2: 20 mmol/L (ref 20–29)
Calcium: 9 mg/dL (ref 8.7–10.2)
Chloride: 102 mmol/L (ref 96–106)
Creatinine, Ser: 0.61 mg/dL (ref 0.57–1.00)
Globulin, Total: 3.4 g/dL (ref 1.5–4.5)
Glucose: 99 mg/dL (ref 70–99)
Potassium: 4.5 mmol/L (ref 3.5–5.2)
Sodium: 138 mmol/L (ref 134–144)
Total Protein: 7.8 g/dL (ref 6.0–8.5)
eGFR: 112 mL/min/1.73 (ref >59)

## 2024-01-10 LAB — CBC WITH DIFFERENTIAL/PLATELET
Basophils Absolute: 0 x10E3/uL (ref 0.0–0.2)
Basos: 1 %
EOS (ABSOLUTE): 0.3 x10E3/uL (ref 0.0–0.4)
Eos: 3 %
Hematocrit: 46.2 % (ref 34.0–46.6)
Hemoglobin: 14.9 g/dL (ref 11.1–15.9)
Immature Grans (Abs): 0 x10E3/uL (ref 0.0–0.1)
Immature Granulocytes: 0 %
Lymphocytes Absolute: 3.6 x10E3/uL — ABNORMAL HIGH (ref 0.7–3.1)
Lymphs: 44 %
MCH: 29.7 pg (ref 26.6–33.0)
MCHC: 32.3 g/dL (ref 31.5–35.7)
MCV: 92 fL (ref 79–97)
Monocytes Absolute: 0.5 x10E3/uL (ref 0.1–0.9)
Monocytes: 6 %
Neutrophils Absolute: 3.8 x10E3/uL (ref 1.4–7.0)
Neutrophils: 46 %
Platelets: 213 x10E3/uL (ref 150–450)
RBC: 5.01 x10E6/uL (ref 3.77–5.28)
RDW: 13.7 % (ref 11.7–15.4)
WBC: 8.1 x10E3/uL (ref 3.4–10.8)

## 2024-01-10 LAB — LIPID PANEL
Chol/HDL Ratio: 6 ratio — ABNORMAL HIGH (ref 0.0–4.4)
Cholesterol, Total: 174 mg/dL (ref 100–199)
HDL: 29 mg/dL — ABNORMAL LOW (ref >39)
LDL Chol Calc (NIH): 113 mg/dL — ABNORMAL HIGH (ref 0–99)
Triglycerides: 182 mg/dL — ABNORMAL HIGH (ref 0–149)
VLDL Cholesterol Cal: 32 mg/dL (ref 5–40)

## 2024-01-10 LAB — HEMOGLOBIN A1C

## 2024-01-11 LAB — SPECIMEN STATUS REPORT

## 2024-01-11 LAB — HEMOGLOBIN A1C
Est. average glucose Bld gHb Est-mCnc: 123 mg/dL
Hgb A1c MFr Bld: 5.9 — AB (ref 4.8–5.6)

## 2024-01-12 ENCOUNTER — Other Ambulatory Visit: Payer: Self-pay

## 2024-01-12 ENCOUNTER — Ambulatory Visit: Payer: Self-pay | Admitting: Primary Care

## 2024-01-12 DIAGNOSIS — E782 Mixed hyperlipidemia: Secondary | ICD-10-CM

## 2024-01-12 MED ORDER — PRAVASTATIN SODIUM 80 MG PO TABS
80.0000 mg | ORAL_TABLET | Freq: Every day | ORAL | 1 refills | Status: DC
  Filled 2024-01-12: qty 30, 30d supply, fill #0
  Filled 2024-03-05: qty 30, 30d supply, fill #1
  Filled 2024-04-12: qty 30, 30d supply, fill #2
  Filled 2024-05-22: qty 30, 30d supply, fill #3
  Filled 2024-06-19: qty 30, 30d supply, fill #4

## 2024-01-19 ENCOUNTER — Other Ambulatory Visit: Payer: Self-pay

## 2024-02-06 ENCOUNTER — Other Ambulatory Visit: Payer: Self-pay

## 2024-02-08 ENCOUNTER — Other Ambulatory Visit: Payer: Self-pay

## 2024-02-15 ENCOUNTER — Encounter: Payer: Self-pay | Admitting: Primary Care

## 2024-03-05 ENCOUNTER — Other Ambulatory Visit: Payer: Self-pay

## 2024-03-06 ENCOUNTER — Other Ambulatory Visit: Payer: Self-pay

## 2024-03-15 ENCOUNTER — Other Ambulatory Visit: Payer: Self-pay

## 2024-04-12 ENCOUNTER — Other Ambulatory Visit: Payer: Self-pay

## 2024-04-17 ENCOUNTER — Other Ambulatory Visit: Payer: Self-pay

## 2024-05-19 ENCOUNTER — Ambulatory Visit (HOSPITAL_COMMUNITY)
Admission: EM | Admit: 2024-05-19 | Discharge: 2024-05-19 | Disposition: A | Discharge status: Home / Self Care | Payer: Self-pay | Attending: Family Medicine | Admitting: Family Medicine

## 2024-05-19 ENCOUNTER — Encounter (HOSPITAL_COMMUNITY): Payer: Self-pay

## 2024-05-19 DIAGNOSIS — R070 Pain in throat: Secondary | ICD-10-CM | POA: Insufficient documentation

## 2024-05-19 DIAGNOSIS — J069 Acute upper respiratory infection, unspecified: Secondary | ICD-10-CM | POA: Insufficient documentation

## 2024-05-19 LAB — POCT INFLUENZA A/B
Influenza A, POC: NEGATIVE
Influenza B, POC: NEGATIVE

## 2024-05-19 LAB — POCT RAPID STREP A (OFFICE): Rapid Strep A Screen: NEGATIVE

## 2024-05-19 LAB — POC SOFIA SARS ANTIGEN FIA: SARS Coronavirus 2 Ag: NEGATIVE

## 2024-05-19 MED ORDER — ONDANSETRON 4 MG PO TBDP: 4.0000 mg | ORAL_TABLET | Freq: Three times a day (TID) | ORAL | 0 refills | Status: DC | PRN

## 2024-05-19 MED ORDER — IBUPROFEN 600 MG PO TABS: 600.0000 mg | ORAL_TABLET | Freq: Three times a day (TID) | ORAL | 0 refills | Status: AC | PRN

## 2024-05-19 NOTE — ED Triage Notes (Signed)
 Interpreter lang used for this session.

## 2024-05-19 NOTE — Discharge Instructions (Addendum)
 The testing for COVID and flu was negative.  Your strep test is negative.  Culture of the throat will be sent, and staff will notify you if that is in turn positive.  Take ibuprofen  800 mg--1 tab every 8 hours as needed for pain.  (Las pruebas de COVID y gripe dieron negativo.  La prueba de estreptococo tambin dio negativo. Se enviar una muestra de la garganta para cultivo y el personal le notificar si el resultado es positivo.  Tome ibuprofeno de 800 mg (1 comprimido cada 8 horas segn sea necesario para el dolor).

## 2024-05-19 NOTE — ED Provider Notes (Signed)
 [facility]    CSN: [id number] Arrival date & time: 05/19/24  1923      History   Chief Complaint Chief Complaint  Patient presents with   Cough   Fever   Generalized Body Aches   Abdominal Pain    HPI Michaela Finley is a 48 y.o. female.    Cough Associated symptoms: fever   Fever Associated symptoms: cough   Abdominal Pain Associated symptoms: cough and fever    Here for fever and myalgia and malaise and sore throat. Symptoms began on 11/11. She has had some nausea, but no v/d. No shortness of breath or tightness in chest. Has no rhinorrhea and very little cough.  She is hurting in her back and abdomen and extremities.  When I ask her about abdominal pain, she states she is not having any. Is having a little nausea only. NKDA  LMP a couple of weeks ago. History reviewed. No pertinent past medical history.  There are no active problems to display for this patient.   History reviewed. No pertinent surgical history.  OB History   No obstetric history on file.      Home Medications    Prior to Admission medications   Medication Sig Start Date End Date Taking? Authorizing Provider  Blood Glucose Monitoring Suppl (TRUE METRIX METER) w/Device KIT Use to check blood sugar three times daily. 02/14/23  Yes Newlin, Enobong, MD  Cholecalciferol  (VITAMIN D3) 50 MCG (2000 UT) capsule Take 1 capsule (2,000 Units total) by mouth daily. 01/02/24  Yes Celestia Rosaline SQUIBB, NP  Dulaglutide  (TRULICITY ) 1.5 MG/0.5ML SOAJ Inject 1.5 mg into the skin once a week. 01/02/24  Yes Celestia Rosaline SQUIBB, NP  ergocalciferol  (VITAMIN D2) 1.25 MG (50000 UT) capsule Take 1 capsule (50,000 Units total) by mouth once a week. 10/11/23  Yes Celestia Rosaline SQUIBB, NP  gabapentin  (NEURONTIN ) 300 MG capsule Take 1 capsule (300 mg total) by mouth 3 (three) times daily. 01/02/24  Yes Celestia Rosaline SQUIBB, NP  glucose blood (TRUE METRIX BLOOD GLUCOSE TEST) test strip Use to check blood sugar  three times daily. 01/02/24  Yes Celestia Rosaline SQUIBB, NP  ibuprofen  (ADVIL ) 600 MG tablet Take 1 tablet (600 mg total) by mouth every 8 (eight) hours as needed (pain). 05/19/24  Yes Vonna Sharlet POUR, MD  Insulin  Pen Needle (PEN NEEDLES) 32G X 4 MM MISC Use to inject Basaglar  once daily. 02/14/23  Yes Newlin, Enobong, MD  metFORMIN  (GLUCOPHAGE -XR) 500 MG 24 hr tablet Take 1 tablet (500 mg total) by mouth daily with breakfast. 01/02/24  Yes Celestia Rosaline SQUIBB, NP  ondansetron (ZOFRAN-ODT) 4 MG disintegrating tablet Take 1 tablet (4 mg total) by mouth every 8 (eight) hours as needed for nausea or vomiting. 05/19/24  Yes Vonna Sharlet POUR, MD  pravastatin  (PRAVACHOL ) 80 MG tablet Take 1 tablet (80 mg total) by mouth daily. 01/12/24  Yes Celestia Rosaline SQUIBB, NP  triamcinolone  cream (KENALOG ) 0.1 % Apply 1 Application topically 2 (two) times daily. 01/02/24  Yes Celestia Rosaline SQUIBB, NP  TRUEplus Lancets 28G MISC Use to check blood sugar three times daily. 02/14/23  Yes Newlin, Enobong, MD    Family History Family History  Problem Relation Age of Onset   Diabetes Mother    Diabetes Father    Migraines Sister     Social History Social History   Tobacco Use   Smoking status: Never   Smokeless tobacco: Never  Vaping Use   Vaping status: Never Used  Substance  Use Topics   Alcohol use: Never   Drug use: Never     Allergies   Patient has no known allergies.   Review of Systems Review of Systems  Constitutional:  Positive for fever.  Respiratory:  Positive for cough.   Gastrointestinal:  Positive for abdominal pain.     Physical Exam Triage Vital Signs ED Triage Vitals [05/19/24 2013]  Encounter Vitals Group     BP 116/70     Girls Systolic BP Percentile      Girls Diastolic BP Percentile      Boys Systolic BP Percentile      Boys Diastolic BP Percentile      Pulse Rate (!) 109     Resp 18     Temp 100 F (37.8 C)     Temp Source Oral     SpO2 95 %     Weight      Height       Head Circumference      Peak Flow      Pain Score 0     Pain Loc      Pain Education      Exclude from Growth Chart    No data found.  Updated Vital Signs BP 116/70 (BP Location: Left Arm)   Pulse (!) 109   Temp 100 F (37.8 C) (Oral)   Resp 18   LMP  (Approximate)   SpO2 95%   Visual Acuity Right Eye Distance:   Left Eye Distance:   Bilateral Distance:    Right Eye Near:   Left Eye Near:    Bilateral Near:     Physical Exam Vitals reviewed.  Constitutional:      General: She is not in acute distress.    Appearance: She is not ill-appearing, toxic-appearing or diaphoretic.  HENT:     Right Ear: Tympanic membrane and ear canal normal.     Left Ear: Tympanic membrane and ear canal normal.     Nose: Nose normal.     Mouth/Throat:     Mouth: Mucous membranes are moist.     Comments: There is erythema of the OP with 1 + tonsillar hypertrophy  Eyes:     Extraocular Movements: Extraocular movements intact.     Conjunctiva/sclera: Conjunctivae normal.     Pupils: Pupils are equal, round, and reactive to light.  Cardiovascular:     Rate and Rhythm: Normal rate and regular rhythm.     Heart sounds: No murmur heard. Pulmonary:     Effort: Pulmonary effort is normal. No respiratory distress.     Breath sounds: No stridor. No wheezing, rhonchi or rales.  Musculoskeletal:     Cervical back: Neck supple.  Lymphadenopathy:     Cervical: No cervical adenopathy.  Skin:    Capillary Refill: Capillary refill takes less than 2 seconds.     Coloration: Skin is not jaundiced or pale.  Neurological:     General: No focal deficit present.     Mental Status: She is alert and oriented to person, place, and time.  Psychiatric:        Behavior: Behavior normal.      UC Treatments / Results  Labs (all labs ordered are listed, but only abnormal results are displayed) Labs Reviewed  CULTURE, GROUP A STREP [facility])  POCT INFLUENZA A/B  POC SOFIA SARS ANTIGEN FIA  POCT RAPID  STREP A (OFFICE)    EKG   Radiology No results found.  Procedures Procedures (including  critical care time)  Medications Ordered in UC Medications - No data to display  Initial Impression / Assessment and Plan / UC Course  I have reviewed the triage vital signs and the nursing notes.  Pertinent labs & imaging results that were available during my care of the patient were reviewed by me and considered in my medical decision making (see chart for details).     Visit conducted in spanish Testing for flu and covid neg Rapid strep is negative.  Throat culture is sent and we will notify and treat protocol if that is positive  Ibuprofen  and zofran sent in for her symptoms Final Clinical Impressions(s) / UC Diagnoses   Final diagnoses:  Viral URI  Throat pain     Discharge Instructions      The testing for COVID and flu was negative.  Your strep test is negative.  Culture of the throat will be sent, and staff will notify you if that is in turn positive.  Take ibuprofen  800 mg--1 tab every 8 hours as needed for pain.  (Las pruebas de COVID y gripe dieron negativo.  La prueba de estreptococo tambin dio negativo. Se enviar una muestra de la garganta para cultivo y el personal le notificar si el resultado es positivo.  Tome ibuprofeno de 800 mg (1 comprimido cada 8 horas segn sea necesario para el dolor).         ED Prescriptions     Medication Sig Dispense Auth. Provider   ibuprofen  (ADVIL ) 600 MG tablet Take 1 tablet (600 mg total) by mouth every 8 (eight) hours as needed (pain). 15 tablet Echo Propp K, MD   ondansetron (ZOFRAN-ODT) 4 MG disintegrating tablet Take 1 tablet (4 mg total) by mouth every 8 (eight) hours as needed for nausea or vomiting. 10 tablet Vonna Yaiden Yang K, MD      PDMP not reviewed this encounter.   Vonna Sharlet POUR, MD 05/19/24 [phone]

## 2024-05-19 NOTE — ED Triage Notes (Signed)
 Cough,Body ache and fever x 4 days. Taking Motrin  every 6 hours.

## 2024-05-21 LAB — CULTURE, GROUP A STREP (THRC)

## 2024-05-22 ENCOUNTER — Other Ambulatory Visit: Payer: Self-pay

## 2024-05-22 ENCOUNTER — Encounter: Payer: Self-pay | Admitting: Nurse Practitioner

## 2024-05-22 ENCOUNTER — Ambulatory Visit: Payer: Self-pay | Attending: Nurse Practitioner | Admitting: Nurse Practitioner

## 2024-05-22 VITALS — BP 108/72 | HR 110 | Temp 99.2°F | Resp 19 | Ht <= 58 in | Wt 165.8 lb

## 2024-05-22 DIAGNOSIS — J020 Streptococcal pharyngitis: Secondary | ICD-10-CM

## 2024-05-22 DIAGNOSIS — B001 Herpesviral vesicular dermatitis: Secondary | ICD-10-CM

## 2024-05-22 MED ORDER — VALACYCLOVIR HCL 1 G PO TABS
2000.0000 mg | ORAL_TABLET | Freq: Two times a day (BID) | ORAL | 0 refills | Status: AC
  Filled 2024-05-22: qty 4, 1d supply, fill #0

## 2024-05-22 MED ORDER — AMOXICILLIN 500 MG PO CAPS
500.0000 mg | ORAL_CAPSULE | Freq: Two times a day (BID) | ORAL | 0 refills | Status: AC
  Filled 2024-05-22: qty 20, 10d supply, fill #0

## 2024-05-22 NOTE — Progress Notes (Signed)
 Assessment & Plan:  Michaela Finley was seen today for generalized body aches.  Diagnoses and all orders for this visit:  Pharyngitis due to Streptococcus species -     amoxicillin (AMOXIL) 500 MG capsule; Take 1 capsule (500 mg total) by mouth 2 (two) times daily for 10 days. Persistent sore throat despite negative strep test. Differential includes viral pharyngitis. Antibiotics considered due to persistent symptoms. - Prescribed antibiotics for presumptive bacterial pharyngitis. - Encouraged warm salt water gargles for throat pain. Warm tea with honey    Herpes labialis -     valACYclovir  (VALTREX ) 1000 MG tablet; Take 2 tablets (2,000 mg total) by mouth 2 (two) times daily for 1 day. FOR COLD SORE Cold sore on lip since onset of fever. No prior treatment for herpes labialis.    Patient has been counseled on age-appropriate routine health concerns for screening and prevention. These are reviewed and up-to-date. Referrals have been placed accordingly. Immunizations are up-to-date or declined.    Subjective:   Chief Complaint  Patient presents with   Generalized Body Aches    Patient states that body pain is all over and limits her movements.    VRI was used to communicate directly with patient for the entire encounter including providing detailed patient instructions.     History of Present Illness Michaela Finley is a 48 year old female who presents with sore throat, body aches, and fever.  This is an acute visit and she is a patient of Rosaline Bohr, ANP  She has been experiencing a sore throat for eight days, described as a sensation of 'something there' in her throat. The sore throat does not improve with ibuprofen , which she takes every five hours.  Associated symptoms include nausea, back pain and generalized body aches associated with her fever. These symptoms have persisted for the past eight days. Temp today in office 99.2  She was evaluated in the ED on 05-19-2024  and tested for COVID, strep A, influenza A/B all of which were negative including strep culture.   She experiences abdominal pain and back pain when lying down, accompanied by nausea but no vomiting. These symptoms have been present for the same duration as her sore throat and body aches.  Additionally, she has a cold sore on her right upper lip lip that appeared when her fever started, and she describes feeling it in the upper part of her mouth as well.  Review of Systems  Constitutional:  Positive for chills, fever and malaise/fatigue.  HENT:  Positive for congestion and sore throat.   Respiratory: Negative.    Cardiovascular: Negative.   Gastrointestinal:  Positive for nausea.  Musculoskeletal:  Positive for back pain, joint pain and myalgias.  Neurological: Negative.   Psychiatric/Behavioral: Negative.      History reviewed. No pertinent past medical history.  History reviewed. No pertinent surgical history.  Family History  Problem Relation Age of Onset   Diabetes Mother    Diabetes Father    Migraines Sister     Social History Reviewed with no changes to be made today.   Outpatient Medications Prior to Visit  Medication Sig Dispense Refill   Blood Glucose Monitoring Suppl (TRUE METRIX METER) w/Device KIT Use to check blood sugar three times daily. 1 kit 0   Cholecalciferol  (VITAMIN D3) 50 MCG (2000 UT) capsule Take 1 capsule (2,000 Units total) by mouth daily. 90 capsule 1   Dulaglutide  (TRULICITY ) 1.5 MG/0.5ML SOAJ Inject 1.5 mg into the skin once a week.  6 mL 1   glucose blood (TRUE METRIX BLOOD GLUCOSE TEST) test strip Use to check blood sugar three times daily. 100 each 6   ibuprofen  (ADVIL ) 600 MG tablet Take 1 tablet (600 mg total) by mouth every 8 (eight) hours as needed (pain). 15 tablet 0   Insulin  Pen Needle (PEN NEEDLES) 32G X 4 MM MISC Use to inject Basaglar  once daily. 100 each 3   metFORMIN  (GLUCOPHAGE -XR) 500 MG 24 hr tablet Take 1 tablet (500 mg total) by  mouth daily with breakfast. 90 tablet 1   pravastatin  (PRAVACHOL ) 80 MG tablet Take 1 tablet (80 mg total) by mouth daily. 90 tablet 1   triamcinolone  cream (KENALOG ) 0.1 % Apply 1 Application topically 2 (two) times daily. 453 g 0   TRUEplus Lancets 28G MISC Use to check blood sugar three times daily. 100 each 6   ergocalciferol  (VITAMIN D2) 1.25 MG (50000 UT) capsule Take 1 capsule (50,000 Units total) by mouth once a week. (Patient not taking: Reported on 05/22/2024) 8 capsule 0   gabapentin  (NEURONTIN ) 300 MG capsule Take 1 capsule (300 mg total) by mouth 3 (three) times daily. (Patient not taking: Reported on 05/22/2024) 90 capsule 3   ondansetron (ZOFRAN-ODT) 4 MG disintegrating tablet Take 1 tablet (4 mg total) by mouth every 8 (eight) hours as needed for nausea or vomiting. (Patient not taking: Reported on 05/22/2024) 10 tablet 0   No facility-administered medications prior to visit.    No Known Allergies     Objective:    BP 108/72 (BP Location: Left Arm, Patient Position: Sitting, Cuff Size: Normal)   Pulse (!) 110   Temp 99.2 F (37.3 C) (Oral)   Resp 19   Ht 4' 10 (1.473 m)   Wt 165 lb 12.8 oz (75.2 kg)   LMP  (Approximate)   SpO2 97%   BMI 34.65 kg/m  Wt Readings from Last 3 Encounters:  05/22/24 165 lb 12.8 oz (75.2 kg)  01/02/24 160 lb 3.2 oz (72.7 kg)  10/03/23 162 lb 3.2 oz (73.6 kg)    Physical Exam Constitutional:      Appearance: She is well-developed. She is ill-appearing.  HENT:     Head: Normocephalic.     Right Ear: Hearing, ear canal and external ear normal. A middle ear effusion is present.     Left Ear: Hearing, ear canal and external ear normal. A middle ear effusion is present.     Nose: Mucosal edema, congestion and rhinorrhea present.     Right Sinus: No maxillary sinus tenderness or frontal sinus tenderness.     Left Sinus: No maxillary sinus tenderness or frontal sinus tenderness.     Mouth/Throat:     Pharynx: Posterior oropharyngeal  erythema present. No oropharyngeal exudate.     Tonsils: No tonsillar abscesses.  Neck:     Thyroid: No thyromegaly.  Cardiovascular:     Rate and Rhythm: Normal rate and regular rhythm.     Heart sounds: No murmur heard.    No friction rub. No gallop.  Pulmonary:     Effort: Pulmonary effort is normal. No respiratory distress.     Breath sounds: Normal breath sounds. No wheezing or rales.  Chest:     Chest wall: No tenderness.  Abdominal:     General: Bowel sounds are normal.     Palpations: Abdomen is soft.  Musculoskeletal:        General: Normal range of motion.     Cervical back: Normal  range of motion.  Lymphadenopathy:     Cervical: No cervical adenopathy.  Skin:    General: Skin is warm and dry.  Neurological:     Mental Status: She is alert and oriented to person, place, and time.  Psychiatric:        Behavior: Behavior normal.        Thought Content: Thought content normal.        Judgment: Judgment normal.          Patient has been counseled extensively about nutrition and exercise as well as the importance of adherence with medications and regular follow-up. The patient was given clear instructions to go to ER or return to medical center if symptoms don't improve, worsen or new problems develop. The patient verbalized understanding.   Follow-up: No follow-ups on file.   Haze LELON Servant, FNP-BC [facility] [place], [place] [phone]   05/22/2024, 12:50 PM

## 2024-05-23 ENCOUNTER — Ambulatory Visit (HOSPITAL_COMMUNITY): Payer: Self-pay

## 2024-06-19 ENCOUNTER — Other Ambulatory Visit: Payer: Self-pay

## 2024-07-09 ENCOUNTER — Ambulatory Visit (INDEPENDENT_AMBULATORY_CARE_PROVIDER_SITE_OTHER): Payer: Self-pay | Admitting: Primary Care

## 2024-07-09 ENCOUNTER — Other Ambulatory Visit: Payer: Self-pay

## 2024-07-09 ENCOUNTER — Encounter (INDEPENDENT_AMBULATORY_CARE_PROVIDER_SITE_OTHER): Payer: Self-pay | Admitting: Primary Care

## 2024-07-09 VITALS — BP 128/82 | HR 78 | Resp 16 | Ht <= 58 in | Wt 155.2 lb

## 2024-07-09 DIAGNOSIS — Z760 Encounter for issue of repeat prescription: Secondary | ICD-10-CM

## 2024-07-09 DIAGNOSIS — E782 Mixed hyperlipidemia: Secondary | ICD-10-CM

## 2024-07-09 DIAGNOSIS — Z1211 Encounter for screening for malignant neoplasm of colon: Secondary | ICD-10-CM

## 2024-07-09 DIAGNOSIS — E559 Vitamin D deficiency, unspecified: Secondary | ICD-10-CM

## 2024-07-09 DIAGNOSIS — E66811 Obesity, class 1: Secondary | ICD-10-CM

## 2024-07-09 DIAGNOSIS — E119 Type 2 diabetes mellitus without complications: Secondary | ICD-10-CM

## 2024-07-09 DIAGNOSIS — Z789 Other specified health status: Secondary | ICD-10-CM

## 2024-07-09 DIAGNOSIS — Z6833 Body mass index (BMI) 33.0-33.9, adult: Secondary | ICD-10-CM

## 2024-07-09 DIAGNOSIS — E6609 Other obesity due to excess calories: Secondary | ICD-10-CM

## 2024-07-09 MED ORDER — TRUE METRIX BLOOD GLUCOSE TEST VI STRP
ORAL_STRIP | 6 refills | Status: AC
  Filled 2024-07-09: qty 100, 30d supply, fill #0

## 2024-07-09 MED ORDER — PEN NEEDLES 32G X 4 MM MISC
3 refills | Status: AC
  Filled 2024-07-09: qty 100, 100d supply, fill #0

## 2024-07-09 NOTE — Patient Instructions (Signed)
 [name]

## 2024-07-09 NOTE — Progress Notes (Signed)
 " [facility]  Michaela Finley, is a 49 y.o. female  RDW:[id number]  FMW:[id number]  DOB - 1975/10/05  Chief Complaint  Patient presents with   Prediabetes   Hyperlipidemia       Subjective:   Michaela Finley is a 49 y.o. obese Hispanic female (interpreter Michaela Finley [phone] )here today for a follow up visit.  Management of type 2 diabetes  -Denies polyuria, polydipsia, polyphasia or vision changes.  Does not check blood sugars at home.  Also hyperlipidemia -labs she is fasting . Patient has No headache, No chest pain, No abdominal pain - No Nausea, No new weakness tingling or numbness, No Cough - shortness of breath   No problems updated.  Comprehensive ROS Pertinent positive and negative noted in HPI   Allergies[1]  No past medical history on file.  Medications Ordered Prior to Encounter[2] Health Maintenance  Topic Date Due   COVID-19 Vaccine (1) Never done   Hepatitis B Vaccine (1 of 3 - 19+ 3-dose series) Never done   Breast Cancer Screening  Never done   Colon Cancer Screening  Never done   Flu Shot  10/02/2024*   DTaP/Tdap/Td vaccine (1 - Tdap) 01/01/2025*   Pneumococcal Vaccine (1 of 2 - PCV) 01/01/2025*   Yearly kidney health urinalysis for diabetes  10/05/2024   Yearly kidney function blood test for diabetes  01/08/2025   Pap with HPV screening  06/29/2026   Hepatitis C Screening  Completed   HIV Screening  Completed   HPV Vaccine  Aged Out   Meningitis B Vaccine  Aged Out  *Topic was postponed. The date shown is not the original due date.    Objective:   Vitals:   07/09/24 0833  BP: 128/82  Pulse: 78  Resp: 16  SpO2: 96%  Weight: 155 lb 3.2 oz (70.4 kg)  Height: 4' 9.5 (1.461 m)   BP Readings from Last 3 Encounters:  07/09/24 128/82  05/22/24 108/72  05/19/24 116/70      Physical Exam Vitals reviewed.  Constitutional:      Appearance: Normal appearance. She is obese.  HENT:     Head: Normocephalic.     Right Ear:  Tympanic membrane, ear canal and external ear normal.     Left Ear: Tympanic membrane, ear canal and external ear normal.     Nose: Nose normal.     Mouth/Throat:     Mouth: Mucous membranes are moist.  Eyes:     Extraocular Movements: Extraocular movements intact.     Pupils: Pupils are equal, round, and reactive to light.  Cardiovascular:     Rate and Rhythm: Normal rate.  Pulmonary:     Effort: Pulmonary effort is normal.     Breath sounds: Normal breath sounds.  Abdominal:     General: Bowel sounds are normal.     Palpations: Abdomen is soft.  Musculoskeletal:        General: Normal range of motion.     Cervical back: Normal range of motion.  Skin:    General: Skin is warm and dry.  Neurological:     Mental Status: She is alert and oriented to person, place, and time.  Psychiatric:        Mood and Affect: Mood normal.        Behavior: Behavior normal.        Thought Content: Thought content normal.       Assessment & Plan  Tynasia was seen today for prediabetes and hyperlipidemia.  Diagnoses and all orders for this visit:  Type 2 diabetes mellitus without complication, without long-term current use of insulin  (HCC) - educated on lifestyle modifications, including but not limited to diet choices and adding exercise to daily routine.   -     CBC with Differential/Platelet -     CMP14+EGFR -     Hemoglobin A1c  Class 1 obesity due to excess calories without serious comorbidity with body mass index (BMI) of 33.0 to 33.9 in adult Lost 10 lbs since last visit . (Also, was sick in the bed for 3 weeks)  Obesity is 30-39 indicating an excess in caloric intake or underlining conditions. This may lead to other co-morbidities. Educated on lifestyle modifications of diet and exercise which may reduce obesity.    Mixed hyperlipidemia -     Lipid panel  Vitamin D  deficiency After prescription did not purchase Vit D 3- daily  -     VITAMIN D  25 Hydroxy (Vit-D Deficiency,  Fractures)  Colon cancer screening -     Fecal occult blood, imunochemical; Future  Medication refill -     Insulin  Pen Needle (PEN NEEDLES) 32G X 4 MM MISC; Use to inject Basaglar  once daily. -     glucose blood (TRUE METRIX BLOOD GLUCOSE TEST) test strip; Use to check blood sugar three times daily.  History of hepatitis B vaccination unknown -     Hepatitis B surface antibody,qualitative      Patient have been counseled extensively about nutrition and exercise. Other issues discussed during this visit include: low cholesterol diet, weight control and daily exercise, foot care, annual eye examinations at Ophthalmology, importance of adherence with medications and regular follow-up. We also discussed long term complications of uncontrolled diabetes and hypertension.   Return in about 3 months (around 10/07/2024) for fasting labs.  The patient was given clear instructions to go to ER or return to medical center if symptoms don't improve, worsen or new problems develop. The patient verbalized understanding. The patient was told to call to get lab results if they haven't heard anything in the next week.   This note has been created with Education officer, environmental. Any transcriptional errors are unintentional.   Rosaline SHAUNNA Bohr, NP 07/09/2024, 9:06 AM    [1] No Known Allergies [2]  Current Outpatient Medications on File Prior to Visit  Medication Sig Dispense Refill   Blood Glucose Monitoring Suppl (TRUE METRIX METER) w/Device KIT Use to check blood sugar three times daily. 1 kit 0   Cholecalciferol  (VITAMIN D3) 50 MCG (2000 UT) capsule Take 1 capsule (2,000 Units total) by mouth daily. 90 capsule 1   Dulaglutide  (TRULICITY ) 1.5 MG/0.5ML SOAJ Inject 1.5 mg into the skin once a week. 6 mL 1   ibuprofen  (ADVIL ) 600 MG tablet Take 1 tablet (600 mg total) by mouth every 8 (eight) hours as needed (pain). 15 tablet 0   metFORMIN  (GLUCOPHAGE -XR) 500 MG 24 hr  tablet Take 1 tablet (500 mg total) by mouth daily with breakfast. 90 tablet 1   pravastatin  (PRAVACHOL ) 80 MG tablet Take 1 tablet (80 mg total) by mouth daily. 90 tablet 1   triamcinolone  cream (KENALOG ) 0.1 % Apply 1 Application topically 2 (two) times daily. 453 g 0   No current facility-administered medications on file prior to visit.   "

## 2024-07-10 LAB — CMP14+EGFR
ALT: 50 IU/L — ABNORMAL HIGH (ref 0–32)
AST: 38 IU/L (ref 0–40)
Albumin: 4.6 g/dL (ref 3.9–4.9)
Alkaline Phosphatase: 112 IU/L (ref 41–116)
BUN/Creatinine Ratio: 21 (ref 9–23)
BUN: 12 mg/dL (ref 6–24)
Bilirubin Total: 0.8 mg/dL (ref 0.0–1.2)
CO2: 21 mmol/L (ref 20–29)
Calcium: 9.8 mg/dL (ref 8.7–10.2)
Chloride: 96 mmol/L (ref 96–106)
Creatinine, Ser: 0.57 mg/dL (ref 0.57–1.00)
Globulin, Total: 4 g/dL (ref 1.5–4.5)
Glucose: 134 mg/dL — ABNORMAL HIGH (ref 70–99)
Potassium: 3.6 mmol/L (ref 3.5–5.2)
Sodium: 136 mmol/L (ref 134–144)
Total Protein: 8.6 g/dL — ABNORMAL HIGH (ref 6.0–8.5)
eGFR: 113 mL/min/1.73

## 2024-07-10 LAB — LIPID PANEL
Chol/HDL Ratio: 4.9 ratio — ABNORMAL HIGH (ref 0.0–4.4)
Cholesterol, Total: 156 mg/dL (ref 100–199)
HDL: 32 mg/dL — ABNORMAL LOW
LDL Chol Calc (NIH): 94 mg/dL (ref 0–99)
Triglycerides: 170 mg/dL — ABNORMAL HIGH (ref 0–149)
VLDL Cholesterol Cal: 30 mg/dL (ref 5–40)

## 2024-07-10 LAB — CBC WITH DIFFERENTIAL/PLATELET
Basophils Absolute: 0.1 x10E3/uL (ref 0.0–0.2)
Basos: 1 %
EOS (ABSOLUTE): 0.3 x10E3/uL (ref 0.0–0.4)
Eos: 4 %
Hematocrit: 45.4 % (ref 34.0–46.6)
Hemoglobin: 14.5 g/dL (ref 11.1–15.9)
Immature Grans (Abs): 0 x10E3/uL (ref 0.0–0.1)
Immature Granulocytes: 0 %
Lymphocytes Absolute: 3.1 x10E3/uL (ref 0.7–3.1)
Lymphs: 43 %
MCH: 29.4 pg (ref 26.6–33.0)
MCHC: 31.9 g/dL (ref 31.5–35.7)
MCV: 92 fL (ref 79–97)
Monocytes Absolute: 0.5 x10E3/uL (ref 0.1–0.9)
Monocytes: 6 %
Neutrophils Absolute: 3.3 x10E3/uL (ref 1.4–7.0)
Neutrophils: 46 %
Platelets: 242 x10E3/uL (ref 150–450)
RBC: 4.93 x10E6/uL (ref 3.77–5.28)
RDW: 14.1 % (ref 11.7–15.4)
WBC: 7.2 x10E3/uL (ref 3.4–10.8)

## 2024-07-10 LAB — HEMOGLOBIN A1C
Est. average glucose Bld gHb Est-mCnc: 134 mg/dL
Hgb A1c MFr Bld: 6.3 % — ABNORMAL HIGH (ref 4.8–5.6)

## 2024-07-10 LAB — VITAMIN D 25 HYDROXY (VIT D DEFICIENCY, FRACTURES): Vit D, 25-Hydroxy: 38 ng/mL (ref 30.0–100.0)

## 2024-07-10 LAB — HEPATITIS B SURFACE ANTIBODY,QUALITATIVE: Hep B Surface Ab, Qual: NONREACTIVE

## 2024-07-15 ENCOUNTER — Ambulatory Visit (INDEPENDENT_AMBULATORY_CARE_PROVIDER_SITE_OTHER): Payer: Self-pay | Admitting: Primary Care

## 2024-07-15 DIAGNOSIS — E782 Mixed hyperlipidemia: Secondary | ICD-10-CM

## 2024-07-15 MED ORDER — PRAVASTATIN SODIUM 80 MG PO TABS
80.0000 mg | ORAL_TABLET | Freq: Every day | ORAL | 1 refills | Status: AC
  Filled 2024-07-15: qty 90, 90d supply, fill #0
  Filled 2024-07-16: qty 30, 30d supply, fill #0
  Filled 2024-07-16: qty 60, 60d supply, fill #0

## 2024-07-16 ENCOUNTER — Other Ambulatory Visit: Payer: Self-pay

## 2024-07-19 ENCOUNTER — Other Ambulatory Visit: Payer: Self-pay

## 2024-07-25 ENCOUNTER — Other Ambulatory Visit: Payer: Self-pay

## 2024-07-26 ENCOUNTER — Other Ambulatory Visit (INDEPENDENT_AMBULATORY_CARE_PROVIDER_SITE_OTHER): Payer: Self-pay | Admitting: Primary Care

## 2024-07-26 ENCOUNTER — Other Ambulatory Visit: Payer: Self-pay

## 2024-07-26 MED ORDER — VITAMIN D3 50 MCG (2000 UT) PO CAPS
2000.0000 [IU] | ORAL_CAPSULE | Freq: Every day | ORAL | 1 refills | Status: AC
  Filled 2024-07-26: qty 90, 90d supply, fill #0

## 2024-07-26 NOTE — Telephone Encounter (Signed)
 Requested Prescriptions  Pending Prescriptions Disp Refills   Cholecalciferol  (VITAMIN D3) 50 MCG (2000 UT) capsule 90 capsule 1    Sig: Take 1 capsule (2,000 Units total) by mouth daily.     Endocrinology:  Vitamins - Vitamin D  Supplementation 2 Failed - 07/26/2024  3:26 PM      Failed - Manual Review: Route requests for 50,000 IU strength to the provider      Passed - Ca in normal range and within 360 days    Calcium   Date Value Ref Range Status  07/09/2024 9.8 8.7 - 10.2 mg/dL Final         Passed - Vitamin D  in normal range and within 360 days    Vit D, 25-Hydroxy  Date Value Ref Range Status  07/09/2024 38.0 30.0 - 100.0 ng/mL Final    Comment:    Vitamin D  deficiency has been defined by the Institute of Medicine and an Endocrine Society practice guideline as a level of serum 25-OH vitamin D  less than 20 ng/mL (1,2). The Endocrine Society went on to further define vitamin D  insufficiency as a level between 21 and 29 ng/mL (2). 1. IOM (Institute of Medicine). 2010. Dietary reference    intakes for calcium  and D. [place]: The    [employer]. 2. Holick MF, Binkley [place], Bischoff-Ferrari HA, et al.    Evaluation, treatment, and prevention of vitamin D     deficiency: an Endocrine Society clinical practice    guideline. JCEM. 2011 Jul; 96(7):1911-30.          Passed - Valid encounter within last 12 months    Recent Outpatient Visits           2 weeks ago Type 2 diabetes mellitus without complication, without long-term current use of insulin  [facility])   [place] [facility] Celestia Rosaline SQUIBB, NP   2 months ago Pharyngitis due to Streptococcus species   [facility] [place] Theotis Haze ORN, NP   6 months ago Tetanus, diphtheria, and acellular pertussis (Tdap) vaccination declined   [place] [facility] Celestia Rosaline SQUIBB, NP   9 months ago Type 2 diabetes mellitus  without complication, without long-term current use of insulin  [facility])   [place] [facility] Celestia Rosaline SQUIBB, NP   1 year ago Cervical cancer screening   [place] [facility] Celestia Rosaline SQUIBB, NP

## 2024-07-27 ENCOUNTER — Other Ambulatory Visit: Payer: Self-pay

## 2024-08-01 ENCOUNTER — Other Ambulatory Visit: Payer: Self-pay

## 2024-10-08 ENCOUNTER — Ambulatory Visit (INDEPENDENT_AMBULATORY_CARE_PROVIDER_SITE_OTHER): Payer: Self-pay | Admitting: Primary Care
